# Patient Record
Sex: Female | Born: 1990 | Race: White | Hispanic: No | Marital: Single | Smoking: Never smoker
Health system: Southern US, Community
[De-identification: ages and names within clinical notes are randomized; demographics above are authoritative.]

## PROBLEM LIST (undated history)

## (undated) HISTORY — PX: COLONOSCOPY: SHX174

---

## 2020-08-29 ENCOUNTER — Encounter (HOSPITAL_COMMUNITY)
Admission: EM | Disposition: A | Discharge status: Home / Self Care | Payer: Self-pay | Source: Home / Self Care | Attending: Obstetrics and Gynecology

## 2020-08-29 ENCOUNTER — Emergency Department (HOSPITAL_BASED_OUTPATIENT_CLINIC_OR_DEPARTMENT_OTHER): Payer: BLUE CROSS/BLUE SHIELD

## 2020-08-29 ENCOUNTER — Encounter (HOSPITAL_BASED_OUTPATIENT_CLINIC_OR_DEPARTMENT_OTHER): Payer: Self-pay | Admitting: Emergency Medicine

## 2020-08-29 ENCOUNTER — Inpatient Hospital Stay (HOSPITAL_COMMUNITY): Payer: BLUE CROSS/BLUE SHIELD | Admitting: Anesthesiology

## 2020-08-29 ENCOUNTER — Other Ambulatory Visit: Payer: Self-pay

## 2020-08-29 ENCOUNTER — Inpatient Hospital Stay (HOSPITAL_BASED_OUTPATIENT_CLINIC_OR_DEPARTMENT_OTHER)
Admission: EM | Admit: 2020-08-29 | Discharge: 2020-09-01 | DRG: 787 | Disposition: A | Discharge status: Home / Self Care | Payer: BLUE CROSS/BLUE SHIELD | Attending: Obstetrics and Gynecology | Admitting: Obstetrics and Gynecology

## 2020-08-29 ENCOUNTER — Other Ambulatory Visit: Payer: Self-pay | Admitting: Obstetrics and Gynecology

## 2020-08-29 DIAGNOSIS — O9902 Anemia complicating childbirth: Secondary | ICD-10-CM | POA: Diagnosis present

## 2020-08-29 DIAGNOSIS — E669 Obesity, unspecified: Secondary | ICD-10-CM | POA: Diagnosis present

## 2020-08-29 DIAGNOSIS — O1423 HELLP syndrome (HELLP), third trimester: Secondary | ICD-10-CM

## 2020-08-29 DIAGNOSIS — O99214 Obesity complicating childbirth: Secondary | ICD-10-CM | POA: Diagnosis present

## 2020-08-29 DIAGNOSIS — O0930 Supervision of pregnancy with insufficient antenatal care, unspecified trimester: Secondary | ICD-10-CM

## 2020-08-29 DIAGNOSIS — Z20822 Contact with and (suspected) exposure to covid-19: Secondary | ICD-10-CM | POA: Diagnosis present

## 2020-08-29 DIAGNOSIS — O9912 Other diseases of the blood and blood-forming organs and certain disorders involving the immune mechanism complicating childbirth: Secondary | ICD-10-CM | POA: Diagnosis present

## 2020-08-29 DIAGNOSIS — Z23 Encounter for immunization: Secondary | ICD-10-CM

## 2020-08-29 DIAGNOSIS — Z3A32 32 weeks gestation of pregnancy: Secondary | ICD-10-CM

## 2020-08-29 DIAGNOSIS — O1420 HELLP syndrome (HELLP), unspecified trimester: Secondary | ICD-10-CM | POA: Diagnosis present

## 2020-08-29 DIAGNOSIS — D696 Thrombocytopenia, unspecified: Secondary | ICD-10-CM | POA: Diagnosis present

## 2020-08-29 DIAGNOSIS — D589 Hereditary hemolytic anemia, unspecified: Secondary | ICD-10-CM | POA: Diagnosis present

## 2020-08-29 DIAGNOSIS — O1424 HELLP syndrome, complicating childbirth: Principal | ICD-10-CM | POA: Diagnosis present

## 2020-08-29 DIAGNOSIS — O1425 HELLP syndrome, complicating the puerperium: Secondary | ICD-10-CM | POA: Diagnosis not present

## 2020-08-29 DIAGNOSIS — R7401 Elevation of levels of liver transaminase levels: Secondary | ICD-10-CM | POA: Diagnosis present

## 2020-08-29 DIAGNOSIS — K089 Disorder of teeth and supporting structures, unspecified: Secondary | ICD-10-CM

## 2020-08-29 DIAGNOSIS — I10 Essential (primary) hypertension: Secondary | ICD-10-CM

## 2020-08-29 LAB — URINALYSIS, ROUTINE W REFLEX MICROSCOPIC
Bilirubin Urine: NEGATIVE
Glucose, UA: NEGATIVE mg/dL
Ketones, ur: NEGATIVE mg/dL
Nitrite: NEGATIVE
Protein, ur: >300 mg/dL — AB
Specific Gravity, Urine: 1.02 (ref 1.005–1.030)
pH: 6.5 (ref 5.0–8.0)

## 2020-08-29 LAB — CBC WITH DIFFERENTIAL/PLATELET
Abs Immature Granulocytes: 0.06 10*3/uL (ref 0.00–0.07)
Basophils Absolute: 0 10*3/uL (ref 0.0–0.1)
Basophils Relative: 0 %
Eosinophils Absolute: 0.2 10*3/uL (ref 0.0–0.5)
Eosinophils Relative: 2 %
HCT: 23.5 % — ABNORMAL LOW (ref 36.0–46.0)
Hemoglobin: 8.2 g/dL — ABNORMAL LOW (ref 12.0–15.0)
Immature Granulocytes: 1 %
Lymphocytes Relative: 19 %
Lymphs Abs: 1.6 10*3/uL (ref 0.7–4.0)
MCH: 29.5 pg (ref 26.0–34.0)
MCHC: 34.9 g/dL (ref 30.0–36.0)
MCV: 84.5 fL (ref 80.0–100.0)
Monocytes Absolute: 0.6 10*3/uL (ref 0.1–1.0)
Monocytes Relative: 7 %
Neutro Abs: 6 10*3/uL (ref 1.7–7.7)
Neutrophils Relative %: 71 %
Platelets: 54 10*3/uL — ABNORMAL LOW (ref 150–400)
RBC: 2.78 MIL/uL — ABNORMAL LOW (ref 3.87–5.11)
RDW: 13.2 % (ref 11.5–15.5)
WBC: 8.3 10*3/uL (ref 4.0–10.5)
nRBC: 0 % (ref 0.0–0.2)

## 2020-08-29 LAB — PROTIME-INR
INR: 0.8 (ref 0.8–1.2)
INR: 0.9 (ref 0.8–1.2)
Prothrombin Time: 10.9 seconds — ABNORMAL LOW (ref 11.4–15.2)
Prothrombin Time: 12.2 seconds (ref 11.4–15.2)

## 2020-08-29 LAB — CBC
HCT: 36.1 % (ref 36.0–46.0)
Hemoglobin: 12.2 g/dL (ref 12.0–15.0)
MCH: 29 pg (ref 26.0–34.0)
MCHC: 33.8 g/dL (ref 30.0–36.0)
MCV: 85.7 fL (ref 80.0–100.0)
Platelets: 55 10*3/uL — ABNORMAL LOW (ref 150–400)
RBC: 4.21 MIL/uL (ref 3.87–5.11)
RDW: 13.4 % (ref 11.5–15.5)
WBC: 14.1 10*3/uL — ABNORMAL HIGH (ref 4.0–10.5)
nRBC: 0 % (ref 0.0–0.2)

## 2020-08-29 LAB — BASIC METABOLIC PANEL
Anion gap: 11 (ref 5–15)
BUN: 12 mg/dL (ref 6–20)
CO2: 20 mmol/L — ABNORMAL LOW (ref 22–32)
Calcium: 8.2 mg/dL — ABNORMAL LOW (ref 8.9–10.3)
Chloride: 102 mmol/L (ref 98–111)
Creatinine, Ser: 0.77 mg/dL (ref 0.44–1.00)
GFR, Estimated: >60 mL/min (ref >60)
Glucose, Bld: 70 mg/dL (ref 70–99)
Potassium: 3.8 mmol/L (ref 3.5–5.1)
Sodium: 133 mmol/L — ABNORMAL LOW (ref 135–145)

## 2020-08-29 LAB — HEPATIC FUNCTION PANEL
ALT: 55 U/L — ABNORMAL HIGH (ref 0–44)
AST: 60 U/L — ABNORMAL HIGH (ref 15–41)
Albumin: 2.7 g/dL — ABNORMAL LOW (ref 3.5–5.0)
Alkaline Phosphatase: 149 U/L — ABNORMAL HIGH (ref 38–126)
Bilirubin, Direct: 0.2 mg/dL (ref 0.0–0.2)
Indirect Bilirubin: 0.6 mg/dL (ref 0.3–0.9)
Total Bilirubin: 0.8 mg/dL (ref 0.3–1.2)
Total Protein: 6 g/dL — ABNORMAL LOW (ref 6.5–8.1)

## 2020-08-29 LAB — PREPARE RBC (CROSSMATCH)

## 2020-08-29 LAB — RAPID HIV SCREEN (HIV 1/2 AB+AG)
HIV 1/2 Antibodies: NONREACTIVE
HIV-1 P24 Antigen - HIV24: NONREACTIVE

## 2020-08-29 LAB — APTT
aPTT: 27 seconds (ref 24–36)
aPTT: 27 seconds (ref 24–36)

## 2020-08-29 LAB — HCG, QUANTITATIVE, PREGNANCY: hCG, Beta Chain, Quant, S: 30702 m[IU]/mL — ABNORMAL HIGH (ref <5)

## 2020-08-29 LAB — COMPREHENSIVE METABOLIC PANEL
ALT: 55 U/L — ABNORMAL HIGH (ref 0–44)
AST: 55 U/L — ABNORMAL HIGH (ref 15–41)
Albumin: 2.3 g/dL — ABNORMAL LOW (ref 3.5–5.0)
Alkaline Phosphatase: 140 U/L — ABNORMAL HIGH (ref 38–126)
Anion gap: 10 (ref 5–15)
BUN: 8 mg/dL (ref 6–20)
CO2: 18 mmol/L — ABNORMAL LOW (ref 22–32)
Calcium: 8 mg/dL — ABNORMAL LOW (ref 8.9–10.3)
Chloride: 103 mmol/L (ref 98–111)
Creatinine, Ser: 0.78 mg/dL (ref 0.44–1.00)
GFR, Estimated: >60 mL/min (ref >60)
Glucose, Bld: 71 mg/dL (ref 70–99)
Potassium: 4.1 mmol/L (ref 3.5–5.1)
Sodium: 131 mmol/L — ABNORMAL LOW (ref 135–145)
Total Bilirubin: 0.9 mg/dL (ref 0.3–1.2)
Total Protein: 5 g/dL — ABNORMAL LOW (ref 6.5–8.1)

## 2020-08-29 LAB — FIBRINOGEN
Fibrinogen: 666 mg/dL — ABNORMAL HIGH (ref 210–475)
Fibrinogen: 570 mg/dL — ABNORMAL HIGH (ref 210–475)

## 2020-08-29 LAB — RESP PANEL BY RT-PCR (FLU A&B, COVID) ARPGX2
Influenza A by PCR: NEGATIVE
Influenza B by PCR: NEGATIVE
SARS Coronavirus 2 by RT PCR: NEGATIVE

## 2020-08-29 LAB — ABO/RH: ABO/RH(D): O POS

## 2020-08-29 LAB — URINALYSIS, MICROSCOPIC (REFLEX): WBC, UA: >50 WBC/hpf (ref 0–5)

## 2020-08-29 LAB — HEMOGLOBIN A1C
Hgb A1c MFr Bld: 5 % (ref 4.8–5.6)
Mean Plasma Glucose: 96.8 mg/dL

## 2020-08-29 LAB — PREGNANCY, URINE: Preg Test, Ur: POSITIVE — AB

## 2020-08-29 LAB — TSH: TSH: 2.365 u[IU]/mL (ref 0.350–4.500)

## 2020-08-29 LAB — HEPATITIS C ANTIBODY: HCV Ab: NONREACTIVE

## 2020-08-29 LAB — HEPATITIS B SURFACE ANTIGEN: Hepatitis B Surface Ag: NONREACTIVE

## 2020-08-29 SURGERY — Surgical Case
Anesthesia: General

## 2020-08-29 MED ORDER — OXYCODONE HCL 5 MG PO TABS: 5.0000 mg | ORAL_TABLET | Freq: Once | ORAL | Status: DC | PRN

## 2020-08-29 MED ORDER — CHLORHEXIDINE GLUCONATE 0.12 % MT SOLN: 15.0000 mL | Freq: Once | OROMUCOSAL | Status: DC

## 2020-08-29 MED ORDER — MAGNESIUM SULFATE 4 GM/100ML IV SOLN
4.0000 g | Freq: Once | INTRAVENOUS | Status: AC
  Administered 2020-08-29: 4 g via INTRAVENOUS
  Filled 2020-08-29: qty 100

## 2020-08-29 MED ORDER — STERILE WATER FOR IRRIGATION IR SOLN
Status: DC | PRN
Start: 2020-08-29 — End: 2020-08-29
  Administered 2020-08-29: 1

## 2020-08-29 MED ORDER — LABETALOL HCL 5 MG/ML IV SOLN
80.0000 mg | INTRAVENOUS | Status: DC | PRN
  Administered 2020-08-29: 80 mg via INTRAVENOUS

## 2020-08-29 MED ORDER — MAGNESIUM SULFATE 40 GM/1000ML IV SOLN: 2.0000 g/h | INTRAVENOUS | Status: DC

## 2020-08-29 MED ORDER — LACTATED RINGERS IV BOLUS
50.0000 mL | Freq: Once | INTRAVENOUS | Status: AC
  Administered 2020-08-29: 50 mL via INTRAVENOUS

## 2020-08-29 MED ORDER — CEFAZOLIN SODIUM-DEXTROSE 2-4 GM/100ML-% IV SOLN
INTRAVENOUS | Status: AC
  Filled 2020-08-29: qty 100

## 2020-08-29 MED ORDER — ORAL CARE MOUTH RINSE: 15.0000 mL | Freq: Once | OROMUCOSAL | Status: DC

## 2020-08-29 MED ORDER — ONDANSETRON HCL 4 MG/2ML IJ SOLN: 4.0000 mg | Freq: Four times a day (QID) | INTRAMUSCULAR | Status: DC | PRN

## 2020-08-29 MED ORDER — BETAMETHASONE SOD PHOS & ACET 6 (3-3) MG/ML IJ SUSP
12.0000 mg | Freq: Once | INTRAMUSCULAR | Status: DC
  Filled 2020-08-29: qty 2

## 2020-08-29 MED ORDER — LABETALOL HCL 5 MG/ML IV SOLN
INTRAVENOUS | Status: AC
  Filled 2020-08-29: qty 4

## 2020-08-29 MED ORDER — LIDOCAINE HCL (CARDIAC) PF 100 MG/5ML IV SOSY
PREFILLED_SYRINGE | INTRAVENOUS | Status: DC | PRN
  Administered 2020-08-29: 100 mg via INTRAVENOUS

## 2020-08-29 MED ORDER — LACTATED RINGERS IV SOLN
INTRAVENOUS | Status: DC
  Administered 2020-08-29: 75 mL/h via INTRAVENOUS

## 2020-08-29 MED ORDER — LABETALOL HCL 5 MG/ML IV SOLN: 20.0000 mg | INTRAVENOUS | Status: DC | PRN

## 2020-08-29 MED ORDER — LABETALOL HCL 5 MG/ML IV SOLN
40.0000 mg | INTRAVENOUS | Status: DC | PRN
  Administered 2020-08-29: 40 mg via INTRAVENOUS

## 2020-08-29 MED ORDER — SODIUM CHLORIDE 0.9% FLUSH: 9.0000 mL | INTRAVENOUS | Status: DC | PRN

## 2020-08-29 MED ORDER — OXYTOCIN-SODIUM CHLORIDE 30-0.9 UT/500ML-% IV SOLN: 2.5000 [IU]/h | INTRAVENOUS | Status: AC

## 2020-08-29 MED ORDER — PROMETHAZINE HCL 25 MG/ML IJ SOLN: 6.2500 mg | INTRAMUSCULAR | Status: DC | PRN

## 2020-08-29 MED ORDER — MENTHOL 3 MG MT LOZG: 1.0000 | LOZENGE | OROMUCOSAL | Status: DC | PRN

## 2020-08-29 MED ORDER — LABETALOL HCL 5 MG/ML IV SOLN
20.0000 mg | INTRAVENOUS | Status: DC | PRN
  Administered 2020-08-29: 20 mg via INTRAVENOUS
  Filled 2020-08-29: qty 4

## 2020-08-29 MED ORDER — CEFAZOLIN SODIUM-DEXTROSE 2-4 GM/100ML-% IV SOLN
2.0000 g | INTRAVENOUS | Status: AC
  Administered 2020-08-29: 2 g via INTRAVENOUS

## 2020-08-29 MED ORDER — PROPOFOL 10 MG/ML IV BOLUS
INTRAVENOUS | Status: DC | PRN
  Administered 2020-08-29: 200 mg via INTRAVENOUS

## 2020-08-29 MED ORDER — HYDRALAZINE HCL 20 MG/ML IJ SOLN
INTRAMUSCULAR | Status: AC
  Filled 2020-08-29: qty 1

## 2020-08-29 MED ORDER — HYDRALAZINE HCL 20 MG/ML IJ SOLN
10.0000 mg | INTRAMUSCULAR | Status: DC | PRN
  Administered 2020-08-29: 10 mg via INTRAVENOUS

## 2020-08-29 MED ORDER — SIMETHICONE 80 MG PO CHEW
80.0000 mg | CHEWABLE_TABLET | Freq: Three times a day (TID) | ORAL | Status: DC
  Administered 2020-08-30 – 2020-09-01 (×8): 80 mg via ORAL
  Filled 2020-08-29 (×8): qty 1

## 2020-08-29 MED ORDER — SOD CITRATE-CITRIC ACID 500-334 MG/5ML PO SOLN
30.0000 mL | Freq: Once | ORAL | Status: AC
  Administered 2020-08-29: 30 mL via ORAL

## 2020-08-29 MED ORDER — HYDRALAZINE HCL 20 MG/ML IJ SOLN: 10.0000 mg | INTRAMUSCULAR | Status: DC | PRN

## 2020-08-29 MED ORDER — LABETALOL HCL 5 MG/ML IV SOLN
20.0000 mg | Freq: Once | INTRAVENOUS | Status: AC
  Administered 2020-08-29: 20 mg via INTRAVENOUS
  Filled 2020-08-29: qty 4

## 2020-08-29 MED ORDER — DEXAMETHASONE SODIUM PHOSPHATE 10 MG/ML IJ SOLN
INTRAMUSCULAR | Status: DC | PRN
  Administered 2020-08-29: 4 mg via INTRAVENOUS

## 2020-08-29 MED ORDER — SUCCINYLCHOLINE CHLORIDE 20 MG/ML IJ SOLN
INTRAMUSCULAR | Status: DC | PRN
  Administered 2020-08-29: 120 mg via INTRAVENOUS

## 2020-08-29 MED ORDER — COCONUT OIL OIL
1.0000 | TOPICAL_OIL | Status: DC | PRN
Start: 2020-08-29 — End: 2020-09-01
  Administered 2020-08-31: 1 via TOPICAL

## 2020-08-29 MED ORDER — FENTANYL CITRATE (PF) 100 MCG/2ML IJ SOLN
INTRAMUSCULAR | Status: AC
  Filled 2020-08-29: qty 2

## 2020-08-29 MED ORDER — TRANEXAMIC ACID-NACL 1000-0.7 MG/100ML-% IV SOLN
1000.0000 mg | INTRAVENOUS | Status: AC
  Administered 2020-08-29: 1000 mg via INTRAVENOUS

## 2020-08-29 MED ORDER — HYDROMORPHONE 1 MG/ML IV SOLN
INTRAVENOUS | Status: DC
  Administered 2020-08-29: 30 mg via INTRAVENOUS
  Administered 2020-08-30: 0 mL via INTRAVENOUS
  Administered 2020-08-30: 1.4 mL via INTRAVENOUS
  Filled 2020-08-29: qty 30

## 2020-08-29 MED ORDER — SCOPOLAMINE 1 MG/3DAYS TD PT72
1.0000 | MEDICATED_PATCH | Freq: Once | TRANSDERMAL | Status: DC
  Administered 2020-08-29: 1.5 mg via TRANSDERMAL

## 2020-08-29 MED ORDER — SOD CITRATE-CITRIC ACID 500-334 MG/5ML PO SOLN
ORAL | Status: AC
  Filled 2020-08-29: qty 30

## 2020-08-29 MED ORDER — MAGNESIUM SULFATE 40 GM/1000ML IV SOLN
2.0000 g/h | INTRAVENOUS | Status: DC
  Administered 2020-08-29: 2 g/h via INTRAVENOUS

## 2020-08-29 MED ORDER — TRANEXAMIC ACID-NACL 1000-0.7 MG/100ML-% IV SOLN
INTRAVENOUS | Status: AC
  Filled 2020-08-29: qty 100

## 2020-08-29 MED ORDER — LABETALOL HCL 5 MG/ML IV SOLN: 40.0000 mg | INTRAVENOUS | Status: DC | PRN

## 2020-08-29 MED ORDER — ONDANSETRON HCL 4 MG/2ML IJ SOLN
INTRAMUSCULAR | Status: AC
  Filled 2020-08-29: qty 2

## 2020-08-29 MED ORDER — SUCCINYLCHOLINE CHLORIDE 200 MG/10ML IV SOSY
PREFILLED_SYRINGE | INTRAVENOUS | Status: AC
  Filled 2020-08-29: qty 10

## 2020-08-29 MED ORDER — WITCH HAZEL-GLYCERIN EX PADS: 1.0000 "application " | MEDICATED_PAD | CUTANEOUS | Status: DC | PRN

## 2020-08-29 MED ORDER — OXYTOCIN-SODIUM CHLORIDE 30-0.9 UT/500ML-% IV SOLN
INTRAVENOUS | Status: AC
  Filled 2020-08-29: qty 500

## 2020-08-29 MED ORDER — HYDRALAZINE HCL 10 MG PO TABS
10.0000 mg | ORAL_TABLET | Freq: Once | ORAL | Status: AC
  Administered 2020-08-29: 10 mg via ORAL
  Filled 2020-08-29: qty 1

## 2020-08-29 MED ORDER — DIPHENHYDRAMINE HCL 12.5 MG/5ML PO ELIX: 12.5000 mg | ORAL_SOLUTION | Freq: Four times a day (QID) | ORAL | Status: DC | PRN

## 2020-08-29 MED ORDER — BETAMETHASONE SOD PHOS & ACET 6 (3-3) MG/ML IJ SUSP
12.0000 mg | Freq: Once | INTRAMUSCULAR | Status: AC
  Administered 2020-08-29: 12 mg via INTRAMUSCULAR
  Filled 2020-08-29: qty 5

## 2020-08-29 MED ORDER — SOD CITRATE-CITRIC ACID 500-334 MG/5ML PO SOLN: 30.0000 mL | ORAL | Status: DC

## 2020-08-29 MED ORDER — FAMOTIDINE 20 MG PO TABS
20.0000 mg | ORAL_TABLET | Freq: Once | ORAL | Status: AC
  Administered 2020-08-29: 20 mg via ORAL

## 2020-08-29 MED ORDER — LACTATED RINGERS IV SOLN: INTRAVENOUS | Status: DC | PRN

## 2020-08-29 MED ORDER — NALOXONE HCL 0.4 MG/ML IJ SOLN: 0.4000 mg | INTRAMUSCULAR | Status: DC | PRN

## 2020-08-29 MED ORDER — PROPOFOL 10 MG/ML IV BOLUS
INTRAVENOUS | Status: AC
  Filled 2020-08-29: qty 20

## 2020-08-29 MED ORDER — SCOPOLAMINE 1 MG/3DAYS TD PT72
MEDICATED_PATCH | TRANSDERMAL | Status: AC
  Filled 2020-08-29: qty 1

## 2020-08-29 MED ORDER — DIPHENHYDRAMINE HCL 25 MG PO CAPS: 25.0000 mg | ORAL_CAPSULE | Freq: Four times a day (QID) | ORAL | Status: DC | PRN

## 2020-08-29 MED ORDER — FENTANYL CITRATE (PF) 250 MCG/5ML IJ SOLN
INTRAMUSCULAR | Status: AC
  Filled 2020-08-29: qty 5

## 2020-08-29 MED ORDER — SODIUM CHLORIDE 0.9 % IR SOLN
Status: DC | PRN
  Administered 2020-08-29: 1

## 2020-08-29 MED ORDER — ONDANSETRON HCL 4 MG/2ML IJ SOLN
INTRAMUSCULAR | Status: DC | PRN
  Administered 2020-08-29: 4 mg via INTRAVENOUS

## 2020-08-29 MED ORDER — FENTANYL CITRATE (PF) 100 MCG/2ML IJ SOLN
25.0000 ug | INTRAMUSCULAR | Status: DC | PRN
  Administered 2020-08-29 (×2): 25 ug via INTRAVENOUS
  Administered 2020-08-29: 50 ug via INTRAVENOUS

## 2020-08-29 MED ORDER — FAMOTIDINE 20 MG PO TABS
ORAL_TABLET | ORAL | Status: AC
  Filled 2020-08-29: qty 1

## 2020-08-29 MED ORDER — LIDOCAINE HCL (PF) 2 % IJ SOLN
INTRAMUSCULAR | Status: AC
  Filled 2020-08-29: qty 5

## 2020-08-29 MED ORDER — LABETALOL HCL 5 MG/ML IV SOLN: 80.0000 mg | INTRAVENOUS | Status: DC | PRN

## 2020-08-29 MED ORDER — SODIUM CHLORIDE 0.9% IV SOLUTION: Freq: Once | INTRAVENOUS | Status: DC

## 2020-08-29 MED ORDER — MIDAZOLAM HCL 2 MG/2ML IJ SOLN
INTRAMUSCULAR | Status: AC
  Filled 2020-08-29: qty 2

## 2020-08-29 MED ORDER — FENTANYL CITRATE (PF) 100 MCG/2ML IJ SOLN
INTRAMUSCULAR | Status: DC | PRN
  Administered 2020-08-29: 50 ug via INTRAVENOUS
  Administered 2020-08-29: 100 ug via INTRAVENOUS
  Administered 2020-08-29 (×2): 50 ug via INTRAVENOUS

## 2020-08-29 MED ORDER — CEFAZOLIN SODIUM-DEXTROSE 2-4 GM/100ML-% IV SOLN: 2.0000 g | INTRAVENOUS | Status: DC

## 2020-08-29 MED ORDER — MAGNESIUM SULFATE 40 GM/1000ML IV SOLN
INTRAVENOUS | Status: AC
  Filled 2020-08-29: qty 1000

## 2020-08-29 MED ORDER — DIBUCAINE (PERIANAL) 1 % EX OINT: 1.0000 "application " | TOPICAL_OINTMENT | CUTANEOUS | Status: DC | PRN

## 2020-08-29 MED ORDER — TRANEXAMIC ACID-NACL 1000-0.7 MG/100ML-% IV SOLN: 1000.0000 mg | Freq: Once | INTRAVENOUS | Status: DC | PRN

## 2020-08-29 MED ORDER — MAGNESIUM SULFATE 50 % IJ SOLN
1.0000 g | Freq: Once | INTRAMUSCULAR | Status: DC
  Filled 2020-08-29: qty 2

## 2020-08-29 MED ORDER — TETANUS-DIPHTH-ACELL PERTUSSIS 5-2.5-18.5 LF-MCG/0.5 IM SUSY
0.5000 mL | PREFILLED_SYRINGE | Freq: Once | INTRAMUSCULAR | Status: AC
  Administered 2020-08-31: 0.5 mL via INTRAMUSCULAR
  Filled 2020-08-29: qty 0.5

## 2020-08-29 MED ORDER — DIPHENHYDRAMINE HCL 50 MG/ML IJ SOLN: 12.5000 mg | Freq: Four times a day (QID) | INTRAMUSCULAR | Status: DC | PRN

## 2020-08-29 MED ORDER — LACTATED RINGERS IV SOLN: INTRAVENOUS | Status: DC

## 2020-08-29 MED ORDER — OXYCODONE HCL 5 MG/5ML PO SOLN: 5.0000 mg | Freq: Once | ORAL | Status: DC | PRN

## 2020-08-29 MED ORDER — SENNOSIDES-DOCUSATE SODIUM 8.6-50 MG PO TABS
2.0000 | ORAL_TABLET | Freq: Every evening | ORAL | Status: DC | PRN
  Administered 2020-08-30: 2 via ORAL

## 2020-08-29 MED ORDER — MIDAZOLAM HCL 2 MG/2ML IJ SOLN
INTRAMUSCULAR | Status: DC | PRN
  Administered 2020-08-29: 2 mg via INTRAVENOUS

## 2020-08-29 MED ORDER — PRENATAL MULTIVITAMIN CH
1.0000 | ORAL_TABLET | Freq: Every day | ORAL | Status: DC
  Administered 2020-08-30 – 2020-09-01 (×3): 1 via ORAL
  Filled 2020-08-29 (×3): qty 1

## 2020-08-29 MED ORDER — OXYTOCIN-SODIUM CHLORIDE 30-0.9 UT/500ML-% IV SOLN
INTRAVENOUS | Status: DC | PRN
  Administered 2020-08-29: 400 mL via INTRAVENOUS

## 2020-08-29 MED ORDER — DEXAMETHASONE SODIUM PHOSPHATE 4 MG/ML IJ SOLN
INTRAMUSCULAR | Status: AC
  Filled 2020-08-29: qty 1

## 2020-08-29 SURGICAL SUPPLY — 37 items
APL SKNCLS STERI-STRIP NONHPOA (GAUZE/BANDAGES/DRESSINGS) ×2
BENZOIN TINCTURE PRP APPL 2/3 (GAUZE/BANDAGES/DRESSINGS) ×3 IMPLANT
CANISTER SUCT 3000ML PPV (MISCELLANEOUS) ×3 IMPLANT
CHLORAPREP W/TINT 26ML (MISCELLANEOUS) ×3 IMPLANT
CLOSURE STERI STRIP 1/2 X4 (GAUZE/BANDAGES/DRESSINGS) ×3 IMPLANT
DRSG OPSITE POSTOP 4X10 (GAUZE/BANDAGES/DRESSINGS) ×3 IMPLANT
ELECT REM PT RETURN 9FT ADLT (ELECTROSURGICAL) ×3
ELECTRODE REM PT RTRN 9FT ADLT (ELECTROSURGICAL) ×2 IMPLANT
EXTRACTOR VACUUM KIWI (MISCELLANEOUS) ×3 IMPLANT
GLOVE BIOGEL PI IND STRL 7.0 (GLOVE) ×4 IMPLANT
GLOVE BIOGEL PI IND STRL 7.5 (GLOVE) ×2 IMPLANT
GLOVE BIOGEL PI INDICATOR 7.0 (GLOVE) ×2
GLOVE BIOGEL PI INDICATOR 7.5 (GLOVE) ×1
GLOVE SKINSENSE NS SZ7.0 (GLOVE) ×1
GLOVE SKINSENSE STRL SZ7.0 (GLOVE) ×2 IMPLANT
GOWN STRL REUS W/ TWL LRG LVL3 (GOWN DISPOSABLE) ×4 IMPLANT
GOWN STRL REUS W/ TWL XL LVL3 (GOWN DISPOSABLE) ×2 IMPLANT
GOWN STRL REUS W/TWL LRG LVL3 (GOWN DISPOSABLE) ×6
GOWN STRL REUS W/TWL XL LVL3 (GOWN DISPOSABLE) ×3
NS IRRIG 1000ML POUR BTL (IV SOLUTION) ×3 IMPLANT
PACK C SECTION WH (CUSTOM PROCEDURE TRAY) ×3 IMPLANT
PAD ABD 7.5X8 STRL (GAUZE/BANDAGES/DRESSINGS) ×3 IMPLANT
PAD OB MATERNITY 4.3X12.25 (PERSONAL CARE ITEMS) ×3 IMPLANT
PAD PREP 24X48 CUFFED NSTRL (MISCELLANEOUS) ×3 IMPLANT
PENCIL SMOKE EVAC W/HOLSTER (ELECTROSURGICAL) ×3 IMPLANT
SPONGE GAUZE 4X4 12PLY STER LF (GAUZE/BANDAGES/DRESSINGS) ×6 IMPLANT
STRIP CLOSURE SKIN 1/2X4 (GAUZE/BANDAGES/DRESSINGS) ×3 IMPLANT
SUT MNCRL 0 VIOLET CTX 36 (SUTURE) ×4 IMPLANT
SUT MON AB 4-0 PS1 27 (SUTURE) ×3 IMPLANT
SUT MONOCRYL 0 CTX 36 (SUTURE) ×2
SUT PLAIN 2 0 XLH (SUTURE) ×3 IMPLANT
SUT VIC AB 0 CT1 36 (SUTURE) ×6 IMPLANT
SUT VIC AB 3-0 CT1 27 (SUTURE) ×3
SUT VIC AB 3-0 CT1 TAPERPNT 27 (SUTURE) ×2 IMPLANT
TAPE CLOTH SURG 4X10 WHT LF (GAUZE/BANDAGES/DRESSINGS) ×3 IMPLANT
TOWEL OR 17X24 6PK STRL BLUE (TOWEL DISPOSABLE) ×6 IMPLANT
WATER STERILE IRR 1000ML POUR (IV SOLUTION) ×3 IMPLANT

## 2020-08-29 NOTE — H&P (Signed)
Obstetrics Admission History & Physical  08/29/2020 - 1:49 PM Primary OBGYN: None  Chief Complaint: HELLP at 32wks  History of Present Illness  30 y.o. G1 @ 32/6(Dating: BPD in the ED), with the above CC. Pregnancy complicated by: BMI 30s, anxiety/depression.  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 BPD showed 32/6 single live pregnancy with subj normal AF and no previa.  Patient has been on wellbutrin, zmbien prn and OCPs and no period in the past year or so.   She received a 4gm Mg bolus in the ED prior to transfer. No BMZ given and I don't believe labetalol was given there either.  Patient denies any s/s of pre-eclampsia or labor.   Review of Systems: as noted in the History of Present Illness.  Patient Active Problem List   Diagnosis Date Noted  . HELLP (hemolytic anemia/elev liver enzymes/low platelets in pregnancy) 08/29/2020     PMHx: History reviewed. No pertinent past medical history. PSHx: History reviewed. No pertinent surgical history. Medications:  Medications Prior to Admission  Medication Sig Dispense Refill Last Dose  . buPROPion (WELLBUTRIN XL) 150 MG 24 hr tablet TAKE 3 TABLETS BY MOUTH IN THE MORNING     . zolpidem (AMBIEN) 10 MG tablet TAKE 1 TABLET BY MOUTH EVERY NIGHT AT BEDTIME AFTER GETTING INTO BEDTIME        Allergies: has No Known Allergies. OBHx:  OB History  Gravida Para Term Preterm AB Living  1            SAB IAB Ectopic Multiple Live Births               # Outcome Date GA Lbr Len/2nd Weight Sex Delivery Anes PTL Lv  1 Current                  FHx: History reviewed. No pertinent family history. Soc Hx:  Social History   Socioeconomic History  . Marital status: Single    Spouse name: Not on file  . Number of children: Not on file  . Years of education: Not on file  . Highest education level: Not on file  Occupational History  . Not  on file  Tobacco Use  . Smoking status: Never Smoker  . Smokeless tobacco: Not on file  Substance and Sexual Activity  . Alcohol use: Yes    Comment: occ  . Drug use: Never  . Sexual activity: Not on file  Other Topics Concern  . Not on file  Social History Narrative  . Not on file   Social Determinants of Health   Financial Resource Strain: Not on file  Food Insecurity: Not on file  Transportation Needs: Not on file  Physical Activity: Not on file  Stress: Not on file  Social Connections: Not on file  Intimate Partner Violence: Not on file    Objective     Current Vital Signs 24h Vital Sign Ranges  T 98.2 F (36.8 C) Temp  Avg: 98.1 F (36.7 C)  Min: 97.9 F (36.6 C)  Max: 98.2 F (36.8 C)  BP (!) 164/118 BP  Min: 148/112  Max: 164/118  HR 88 Pulse  Avg: 83.6  Min: 71  Max: 99  RR 18 Resp  Avg: 17.9  Min: 11  Max: 20  SaO2 100 % Room Air SpO2  Avg: 98.7 %  Min: 96 %  Max: 100 %  24 Hour I/O Current Shift I/O  Time Ins Outs No intake/output data recorded. No intake/output data recorded.   EFM: 130 baseline, ?accels, no decel, min-mod variability  Toco: quiet  General: Well nourished, well developed female in no acute distress.  Skin:  Warm and dry.  Cardiovascular: S1, S2 normal, no murmur, rub or gallop, regular rate and rhythm Respiratory:  Clear to auscultation bilateral. Normal respiratory effort Abdomen: soft, nttp Neuro/Psych:  Normal mood and affect.    Labs  Recent Labs  Lab 08/29/20 0901  WBC 8.3  HGB 8.2*  HCT 23.5*  PLT 54*    Recent Labs  Lab 08/29/20 0901  NA 133*  K 3.8  CL 102  CO2 20*  BUN 12  CREATININE 0.77  CALCIUM 8.2*  PROT 6.0*  BILITOT 0.8  ALKPHOS 149*  ALT 55*  AST 60*  GLUCOSE 70   PTT 27 PT/INR 10.9/0.8 Negative: COVID  Pending: fibrinogen  Radiology Narrative & Impression  CLINICAL DATA:  30 year old female with hypertension, headache and bilateral lower extremity edema  EXAM: PORTABLE  CHEST 1 VIEW  COMPARISON:  None.  FINDINGS: The lungs are clear and negative for focal airspace consolidation, pulmonary edema or suspicious pulmonary nodule. No pleural effusion or pneumothorax. Cardiac and mediastinal contours are within normal limits. No acute fracture or lytic or blastic osseous lesions. The visualized upper abdominal bowel gas pattern is unremarkable.  IMPRESSION: No active disease.   Electronically Signed   By: Malachy Moan M.D.   On: 08/29/2020 09:47    Assessment & Plan   pt stable *Pregnancy: fetal status reassuring. Variability likely due to Mg *HELLP: continue Mg at 2gm per hour and IV labetalol per protocol. D/w her recommendation for delivery via c-section given remote from delivery. I also d/w her that will do vertical midline skin to cut down on bleeding risk and there is risk of needing blood products, hysterectomy. I also told her she'll likely need general anesthesia; pt aware and is amenable to proceeding with c/s today when labs are back and products ready *Preterm: BMZ to be given now. NICU aware  I asked her and she said it was fine to update her parents; they were called and updated about her status.   Cornelia Copa MD [phone] Attending Center for [facility])

## 2020-08-29 NOTE — Transfer of Care (Signed)
Immediate Anesthesia Transfer of Care Note  Patient: Jessica Zhang  Procedure(s) Performed: CESAREAN SECTION (Bilateral ) APPLICATION OF CELL SAVER (N/A )  Patient Location: PACU  Anesthesia Type:General  Level of Consciousness: awake, alert  and patient cooperative  Airway & Oxygen Therapy: Patient Spontanous Breathing  Post-op Assessment: Report given to RN and Post -op Vital signs reviewed and stable  Post vital signs: Reviewed and stable  Last Vitals:  Vitals Value Taken Time  BP 117/79 08/29/20 1730  Temp 36.4 C 08/29/20 1730  Pulse 79 08/29/20 1732  Resp 11 08/29/20 1732  SpO2 99 % 08/29/20 1732  Vitals shown include unvalidated device data.  Last Pain:  Vitals:   08/29/20 1730  TempSrc: Oral  PainSc: 7          Complications: No complications documented.

## 2020-08-29 NOTE — Progress Notes (Signed)
Spoke with Dr. Vergie Living and told him they were not putting the pt on the fetal monitor. I asked him to call them himself and I was told to call and tell them he said to put the pt on the fetal monitor.

## 2020-08-29 NOTE — Progress Notes (Signed)
Spoke with [facility] RN. No one has called [facility]. The pt should be on the fetal monitor since they are starting magnesium sulfate and the pt has HEELP syndrome. The policy is to call [facility] anytime there is a pt greater than [redacted] weeks gestation. She says that the pt has had an ultrasound, they are not sure of her gestational age. I told her that the pt should be on the fetal monitor. She says they will be transferring her soon.

## 2020-08-29 NOTE — Progress Notes (Signed)
Spoke with Dr. Vergie Living. There is a pt at [facility] that is in HELLP syndrome and is to be transferred here. No one from [facility] has called [facility] to put the pt on the monitor. He says he didn't know they could do that and to call and tell them to place the pt on the monitor.

## 2020-08-29 NOTE — Progress Notes (Signed)
OB Note I was called by [facility] ED with 32wk patient presenting for swelling. She has HELLP syndrome and needs to be delivered. I advised Mg 4gm bolus and 2gm per hour, betamethasone 12mg  IM x 1 and to get a covid test, fibrinogen and PTT and make NPO.  I called NICU and they have bed space available.  I asked Dr. to get her here ASAP. I talked to the OB OR and plan is to repeat her labs when she gets here and c-section once labs and blood products are ready  Wilkie Aye MD [phone] Attending Center for [facility] (Faculty Practice) 08/29/2020 Time: 1209pm

## 2020-08-29 NOTE — Op Note (Signed)
Operative Note   SURGERY DATE: 08/29/2020  PRE-OP DIAGNOSIS:  *Pregnancy at 32/6 by femur length *HELLP syndrome (elevated BPs, AST/ALT; thrombocytopenia) *No prenatal care *BMI 30s  POST-OP DIAGNOSIS: Same. Delivered   PROCEDURE: Primary low transverse cesarean section via Claretha Cooper skin incision with double layer uterine closure  SURGEON: Surgeon(s) and Role:    [place] Bing, MD - Primary  ASSISTANT: None  ANESTHESIA: general  ESTIMATED BLOOD LOSS:  DRAINS: UOP via indwelling foley  TOTAL IV FLUIDS: crystalloid  VTE PROPHYLAXIS: SCDs to bilateral lower extremities  ANTIBIOTICS: Two grams of Cefazolin were given., within 1 hour of skin incision  SPECIMENS: placenta to pathology.   COMPLICATIONS: none  INDICATIONS: Given normal coags and stable platelets, decision made for low transverse skin incision  FINDINGS: No intra-abdominal adhesions were noted. Grossly normal uterus, tubes and ovaries. Clear amniotic fluid, cephalic, loose body cord x 1, female infant, weight 1960gm, APGARs 8/9, intact placenta.  PROCEDURE IN DETAIL: The patient was taken to the operating room where she was prepped and draped in the normal fashion in the dorsal supine position with a leftward tilt.  After a time out was performed, the patient underwent general anesthesia and a Claretha Cooper skin incision was made with the scalpel and carried through to the underlying layer of fascia. The fascia was then bluntly dissected off the rectus muscles and then the muscles and the peritoneum were then separated in the midline and the peritoneum was entered bluntly. The vesicouterine peritoneum was identified and an Teacher, early years/pre placed.  A low transverse hysterotomy was made with the scalpel until the endometrial cavity was breached and the amniotic sac ruptured with the Allis clamp, yielding clear amniotic fluid. This incision was extended bluntly and the infant's head, shoulders and body  were delivered atraumatically.The cord was clamped x 2 and cut, and the infant was handed to the awaiting pediatricians, after delayed cord clamping was not done due to general anesthesia.  The placenta was then gradually expressed from the uterus and then the uterus was exteriorized and cleared of all clots and debris. The hysterotomy was repaired with a running suture of 1-0 monocryl. A second imbricating layer of 1-0 monocryl suture was then placed  to achieve excellent hemostasis.   The uterus and adnexa were then returned to the abdomen, and the hysterotomy and all operative sites were reinspected and excellent hemostasis was noted after irrigation and suction of the abdomen with warm saline.  The peritoneum was closed with a running stitch of 3-0 Vicryl. The fascia was reapproximated with 0 Vicryl in a simple running fashion bilaterally. The subcutaneous layer was then reapproximated with interrupted sutures of 2-0 plain gut, and the skin was then closed with 4-0 monocryl, in a subcuticular fashion.  The patient  tolerated the procedure well. Sponge, lap, needle, and instrument counts were correct x 2. The patient was transferred to the recovery room awake, alert and breathing independently in stable condition.  Cornelia Copa MD Attending Center for [facility])

## 2020-08-29 NOTE — Anesthesia Procedure Notes (Signed)
Procedure Name: Intubation Date/Time: 08/29/2020 4:25 PM Performed by: Sandrea Matte, CRNA Pre-anesthesia Checklist: Patient identified, Emergency Drugs available, Suction available and Patient being monitored Patient Re-evaluated:Patient Re-evaluated prior to induction Oxygen Delivery Method: Circle system utilized Preoxygenation: Pre-oxygenation with 100% oxygen Induction Type: Rapid sequence and Cricoid Pressure applied Laryngoscope Size: Mac and 4 Grade View: Grade I Tube type: Oral Tube size: 7.0 mm Number of attempts: 1 Airway Equipment and Method: Stylet Placement Confirmation: ETT inserted through vocal cords under direct vision,  positive ETCO2,  CO2 detector and breath sounds checked- equal and bilateral Secured at: 21 cm Dental Injury: Teeth and Oropharynx as per pre-operative assessment

## 2020-08-29 NOTE — Progress Notes (Addendum)
OB Note Platelets almost done and then can proceed to OR. Fetus category I with ?accels.   Cornelia Copa MD Attending Center for [employer] (Faculty Practice) 08/29/2020 Time: [phone]

## 2020-08-29 NOTE — ED Notes (Signed)
Report given to [facility], OR at [facility]. All belongings sent with pt

## 2020-08-29 NOTE — Anesthesia Postprocedure Evaluation (Signed)
Anesthesia Post Note  Patient: Jessica Zhang  Procedure(s) Performed: CESAREAN SECTION (Bilateral ) APPLICATION OF CELL SAVER (N/A )     Patient location during evaluation: PACU Anesthesia Type: General Level of consciousness: awake and alert Pain management: pain level controlled Vital Signs Assessment: post-procedure vital signs reviewed and stable Respiratory status: spontaneous breathing, nonlabored ventilation and respiratory function stable Cardiovascular status: blood pressure returned to baseline and stable Postop Assessment: no apparent nausea or vomiting Anesthetic complications: no   No complications documented.  Last Vitals:  Vitals:   08/29/20 1855 08/29/20 2019  BP: 124/77 (!) 132/92  Pulse: 64 (!) 58  Resp:  17  Temp: 36.6 C 36.5 C  SpO2: 96% 100%    Last Pain:  Vitals:   08/29/20 2019  TempSrc: Oral  PainSc:    Pain Goal:                   Beryle Lathe

## 2020-08-29 NOTE — ED Notes (Signed)
Patient transported to ultrasound.

## 2020-08-29 NOTE — Progress Notes (Signed)
Spoke with [facility] RN and told her the pt needs to be on the fetal monitor at the request of Dr. Vergie Living. She says they are getting the pt ready for transfer. EMS has arrived.

## 2020-08-29 NOTE — Lactation Note (Signed)
This note was copied from a baby's chart. Lactation Consultation Note  Patient Name: Jessica Zhang [id number] Date: 08/29/2020 Reason for consult: Infant < 6lbs;Preterm <34wks;Initial assessment;Primapara;1st time breastfeeding;NICU baby;Other (Comment) (HELLP syndrome) Age:30 hours  Visited with mom of 7 hours old pre-term female, she's a P1 and didn't report breast changes during the prengancy. Her RN has already set her up with a pump, even though mom had HELLP syndrome and complications during delivery (she came to the ED and didn't know she was pregnant) she was willing to start pumping today.  LC showed mom how to hand express and she was able to get a small droplet of colostrum, praised her for her efforts. Mom noted that her baby might not be a 47 0/7 as stated on provider's note but closer to [redacted] weeks gestation. Baby still on NPO and under observation but mom is aware that if she gets droplets of colostrum they can be used to provide oral care for baby.  Reviewed benefits of breastmilk for NICU babies, premature milk, pumping schedule, breastmilk storage guidelines and lactogenesis II. Mom pumped again for the second time during [facility] consultation, praised her for her efforts.  Feeding plan:  1. Encouraged mom to pump every 2-3 hours, ideally 8 pumping sessions/24 hours but she'll go at her own pace until she's more stable with HELLP syndrome 2. Hand expression and breast massage were also encouraged prior pumping 3. She'll call her insurance company to inquire about her DEBP  BF brochure, BF resources and NICU booklet were reviewed. GOB (maternal) present and very supportive, MOB is the only child. Family reported all questions and concerns were answered, they're aware of LC OP services and will call PRN.   Maternal Data Has patient been taught Hand Expression?: Yes Does the patient have breastfeeding experience prior to this delivery?: No  Feeding Mother's Current Feeding Choice:  Breast Milk and Formula Nipple Type: Nfant Extra Slow Flow (gold)  Lactation Tools Discussed/Used Tools: Pump Breast pump type: Double-Electric Breast Pump Pump Education: Setup, frequency, and cleaning;Milk Storage Reason for Pumping: pre-term NICU baby < 5 lbs Pumping frequency: q 3 hours Pumped volume: 0 mL  Interventions Interventions: Breast feeding basics reviewed;DEBP;Hand express;Breast massage  Discharge Pump: Personal (She'll call [employer] to ask for a DEBP) WIC Program: No  Consult Status Consult Status: Follow-up Date: 08/30/20 Follow-up type: In-patient    Jessica Zhang Jessica Zhang 08/29/2020, 11:41 PM

## 2020-08-29 NOTE — ED Provider Notes (Addendum)
[facility] EMERGENCY DEPARTMENT Provider Note   CSN: [id number] Arrival date & time: 08/29/20  0755     History Chief Complaint  Patient presents with  . Hypertension    Jessica Zhang is a 30 y.o. female.  HPI   30 year old female with no admitted past medical history presents emergency department with concern for high blood pressure.  Patient states about 5 days ago she noticed that she was having swelling of her bilateral feet, when she took her blood pressure was high, she been monitoring it for the past 5 days and noticed that it has remained high.  She does have family history of high blood pressure on her father side.  She does not believe that she is currently pregnant.  She states she is otherwise been in her usual state of health.  Denies any chest pain, shortness of breath, cough, abdominal pain.    History reviewed. No pertinent past medical history.  There are no problems to display for this patient.   History reviewed. No pertinent surgical history.   OB History   No obstetric history on file.     History reviewed. No pertinent family history.  Social History   Tobacco Use  . Smoking status: Never Smoker  Substance Use Topics  . Alcohol use: Yes    Comment: occ  . Drug use: Never    Home Medications Prior to Admission medications   Medication Sig Start Date End Date Taking? Authorizing Provider  buPROPion (WELLBUTRIN XL) 150 MG 24 hr tablet TAKE 3 TABLETS BY MOUTH IN THE MORNING 07/04/19  Yes [provider]  zolpidem (AMBIEN) 10 MG tablet TAKE 1 TABLET BY MOUTH EVERY NIGHT AT BEDTIME AFTER GETTING INTO BEDTIME 07/04/19  Yes [provider]    Allergies    Patient has no allergy information on record.  Review of Systems   Review of Systems  Constitutional: Negative for chills and fever.  HENT: Negative for congestion.   Eyes: Negative for visual disturbance.  Respiratory: Negative for cough and shortness of breath.    Cardiovascular: Positive for palpitations and leg swelling. Negative for chest pain.  Gastrointestinal: Negative for abdominal pain, diarrhea and vomiting.  Genitourinary: Negative for dysuria.  Skin: Negative for rash.  Neurological: Negative for headaches.    Physical Exam Updated Vital Signs BP (!) 157/105   Pulse 88   Temp 97.9 F (36.6 C) (Oral)   Resp 16   Ht 5\' 8"  (1.727 m)   Wt 97.5 kg   SpO2 96%   BMI 32.69 kg/m   Physical Exam Vitals and nursing note reviewed.  Constitutional:      Appearance: Normal appearance.  HENT:     Head: Normocephalic.     Mouth/Throat:     Mouth: Mucous membranes are moist.  Cardiovascular:     Rate and Rhythm: Normal rate.  Pulmonary:     Effort: Pulmonary effort is normal. No respiratory distress.     Breath sounds: No rales.  Abdominal:     Palpations: Abdomen is soft.     Tenderness: There is no abdominal tenderness.  Musculoskeletal:     Comments: Trace edema of the bilateral feet/ankles, no calf swelling, no calf tenderness  Skin:    General: Skin is warm.  Neurological:     Mental Status: She is alert and oriented to person, place, and time. Mental status is at baseline.  Psychiatric:        Mood and Affect: Mood normal.  ED Results / Procedures / Treatments   Labs (all labs ordered are listed, but only abnormal results are displayed) Labs Reviewed  PREGNANCY, URINE  CBC WITH DIFFERENTIAL/PLATELET  BASIC METABOLIC PANEL  URINALYSIS, ROUTINE W REFLEX MICROSCOPIC    EKG None  Radiology No results found.  Procedures .Critical Care Performed by: Rozelle Logan, DO Authorized by: Rozelle Logan, DO   Critical care provider statement:    Critical care time (minutes):  45   Critical care was time spent personally by me on the following activities:  Discussions with consultants, evaluation of patient's response to treatment, examination of patient, ordering and performing treatments and interventions,  ordering and review of laboratory studies, ordering and review of radiographic studies, pulse oximetry, re-evaluation of patient's condition, obtaining history from patient or surrogate and review of old charts Comments:     Newly diagnosed preeclampsia with HELLP syndrome, multiple consultations and emergent medications/transfer.     Medications Ordered in ED Medications  hydrALAZINE (APRESOLINE) tablet 10 mg (has no administration in time range)    ED Course  I have reviewed the triage vital signs and the nursing notes.  Pertinent labs & imaging results that were available during my care of the patient were reviewed by me and considered in my medical decision making (see chart for details).    MDM Rules/Calculators/A&P                          30 year old female presents the emergency department with concern for hypertension.  She is hypertensive on arrival but otherwise normal vitals.  She has trace edema of the bilateral feet/ankles, no calf swelling, does not seem consistent with DVT given its bilateral nature.  Arrival EKG is sinus rhythm with a rate of 83, no acute ischemic changes.  She is chest pain-free and otherwise denies any symptoms.  Pregnancy test is positive, further work-up shows that the patient is actually estimated to be about [redacted] weeks pregnant by femur length. She is completely unaware of this. More concerning is her blood work and presentation is consistent with HELLP syndrome.  OB/GYN Dr. Vergie Living consulted, recommends treatment for help with IV magnesium, IM betamethasone and labetalol.  She will be transferred emergently to [facility] hospital for C-section and further treatment.  Final Clinical Impression(s) / ED Diagnoses Final diagnoses:  None    Rx / DC Orders ED Discharge Orders    None       Rozelle Logan, DO 08/29/20 1217    Layken Doenges, Clabe Seal, DO 08/30/20 [phone]

## 2020-08-29 NOTE — ED Triage Notes (Signed)
Pt arrives pov with c/o acute onset hypertension, HA, and bilateral lower extremity swelling x 5 days. Pt denies CP, denies hx of hypertension

## 2020-08-29 NOTE — ED Notes (Signed)
ED Provider at bedside. 

## 2020-08-29 NOTE — Anesthesia Preprocedure Evaluation (Addendum)
Anesthesia Evaluation  Patient identified by MRN, date of birth, ID band Patient awake    Reviewed: Allergy & Precautions, NPO status , Patient's Chart, lab work & pertinent test results  History of Anesthesia Complications Negative for: history of anesthetic complications  Airway Mallampati: I  TM Distance: >3 FB Neck ROM: Full    Dental  (+) Dental Advisory Given, Teeth Intact   Pulmonary neg pulmonary ROS,    Pulmonary exam normal        Cardiovascular hypertension (HELLP), Normal cardiovascular exam     Neuro/Psych negative neurological ROS  negative psych ROS   GI/Hepatic negative GI ROS,  Elevated LFTs    Endo/Other   Obesity   Renal/GU negative Renal ROS     Musculoskeletal negative musculoskeletal ROS (+)   Abdominal   Peds  Hematology  (+) anemia ,  Thrombocytopenia, PLT 54k    Anesthesia Other Findings HELLP syndrome Covid test negative   Reproductive/Obstetrics (+) Pregnancy                            Anesthesia Physical Anesthesia Plan  ASA: II and emergent  Anesthesia Plan: General   Post-op Pain Management:    Induction: Intravenous and Rapid sequence  PONV Risk Score and Plan: 3 and Treatment may vary due to age or medical condition, Ondansetron, Dexamethasone and Scopolamine patch - Pre-op  Airway Management Planned: Oral ETT  Additional Equipment: None  Intra-op Plan:   Post-operative Plan: Extubation in OR  Informed Consent: I have reviewed the patients History and Physical, chart, labs and discussed the procedure including the risks, benefits and alternatives for the proposed anesthesia with the patient or authorized representative who has indicated his/her understanding and acceptance.     Dental advisory given  Plan Discussed with: CRNA and Anesthesiologist  Anesthesia Plan Comments:        Anesthesia Quick Evaluation

## 2020-08-30 ENCOUNTER — Encounter (HOSPITAL_COMMUNITY): Payer: Self-pay | Admitting: Obstetrics and Gynecology

## 2020-08-30 DIAGNOSIS — E669 Obesity, unspecified: Secondary | ICD-10-CM | POA: Diagnosis present

## 2020-08-30 DIAGNOSIS — K089 Disorder of teeth and supporting structures, unspecified: Secondary | ICD-10-CM

## 2020-08-30 DIAGNOSIS — O1425 HELLP syndrome, complicating the puerperium: Secondary | ICD-10-CM

## 2020-08-30 DIAGNOSIS — O0930 Supervision of pregnancy with insufficient antenatal care, unspecified trimester: Secondary | ICD-10-CM

## 2020-08-30 DIAGNOSIS — O99119 Other diseases of the blood and blood-forming organs and certain disorders involving the immune mechanism complicating pregnancy, unspecified trimester: Secondary | ICD-10-CM | POA: Diagnosis present

## 2020-08-30 DIAGNOSIS — D696 Thrombocytopenia, unspecified: Secondary | ICD-10-CM | POA: Diagnosis present

## 2020-08-30 DIAGNOSIS — R7401 Elevation of levels of liver transaminase levels: Secondary | ICD-10-CM | POA: Diagnosis present

## 2020-08-30 LAB — BPAM PLATELET PHERESIS
Blood Product Expiration Date: 202204032359
Blood Product Expiration Date: 202204032359
ISSUE DATE / TIME: 202204021505
ISSUE DATE / TIME: 202204021505
Unit Type and Rh: 5100
Unit Type and Rh: 6200

## 2020-08-30 LAB — CBC
HCT: 37 % (ref 36.0–46.0)
HCT: 32.4 % — ABNORMAL LOW (ref 36.0–46.0)
Hemoglobin: 12.3 g/dL (ref 12.0–15.0)
Hemoglobin: 11.3 g/dL — ABNORMAL LOW (ref 12.0–15.0)
MCH: 29.2 pg (ref 26.0–34.0)
MCH: 30.1 pg (ref 26.0–34.0)
MCHC: 33.2 g/dL (ref 30.0–36.0)
MCHC: 34.9 g/dL (ref 30.0–36.0)
MCV: 87.9 fL (ref 80.0–100.0)
MCV: 86.2 fL (ref 80.0–100.0)
Platelets: 100 10*3/uL — ABNORMAL LOW (ref 150–400)
Platelets: 121 10*3/uL — ABNORMAL LOW (ref 150–400)
RBC: 4.21 MIL/uL (ref 3.87–5.11)
RBC: 3.76 MIL/uL — ABNORMAL LOW (ref 3.87–5.11)
RDW: 13.9 % (ref 11.5–15.5)
RDW: 13.9 % (ref 11.5–15.5)
WBC: 18.4 10*3/uL — ABNORMAL HIGH (ref 4.0–10.5)
WBC: 20 10*3/uL — ABNORMAL HIGH (ref 4.0–10.5)
nRBC: 0 % (ref 0.0–0.2)
nRBC: 0 % (ref 0.0–0.2)

## 2020-08-30 LAB — COMPREHENSIVE METABOLIC PANEL
ALT: 37 U/L (ref 0–44)
AST: 34 U/L (ref 15–41)
Albumin: 2 g/dL — ABNORMAL LOW (ref 3.5–5.0)
Alkaline Phosphatase: 115 U/L (ref 38–126)
Anion gap: 8 (ref 5–15)
BUN: 10 mg/dL (ref 6–20)
CO2: 20 mmol/L — ABNORMAL LOW (ref 22–32)
Calcium: 6.5 mg/dL — ABNORMAL LOW (ref 8.9–10.3)
Chloride: 106 mmol/L (ref 98–111)
Creatinine, Ser: 0.82 mg/dL (ref 0.44–1.00)
GFR, Estimated: >60 mL/min (ref >60)
Glucose, Bld: 119 mg/dL — ABNORMAL HIGH (ref 70–99)
Potassium: 4.9 mmol/L (ref 3.5–5.1)
Sodium: 134 mmol/L — ABNORMAL LOW (ref 135–145)
Total Bilirubin: 0.2 mg/dL — ABNORMAL LOW (ref 0.3–1.2)
Total Protein: 4.6 g/dL — ABNORMAL LOW (ref 6.5–8.1)

## 2020-08-30 LAB — PREPARE PLATELET PHERESIS
Unit division: 0
Unit division: 0

## 2020-08-30 LAB — RPR: RPR Ser Ql: NONREACTIVE

## 2020-08-30 MED ORDER — ZOLPIDEM TARTRATE 5 MG PO TABS
5.0000 mg | ORAL_TABLET | Freq: Every evening | ORAL | Status: DC | PRN
  Administered 2020-08-30 – 2020-08-31 (×2): 5 mg via ORAL
  Filled 2020-08-30 (×2): qty 1

## 2020-08-30 MED ORDER — LACTATED RINGERS IV SOLN: INTRAVENOUS | Status: DC

## 2020-08-30 MED ORDER — MAGNESIUM SULFATE 40 GM/1000ML IV SOLN
2.0000 g/h | INTRAVENOUS | Status: AC
  Administered 2020-08-30: 2 g/h via INTRAVENOUS

## 2020-08-30 MED ORDER — BUPROPION HCL ER (XL) 300 MG PO TB24
450.0000 mg | ORAL_TABLET | Freq: Every day | ORAL | Status: DC
  Administered 2020-08-30 – 2020-09-01 (×3): 450 mg via ORAL
  Filled 2020-08-30 (×3): qty 1

## 2020-08-30 MED ORDER — BUPROPION HCL ER (XL) 150 MG PO TB24: 150.0000 mg | ORAL_TABLET | Freq: Every day | ORAL | Status: DC

## 2020-08-30 MED ORDER — OXYCODONE HCL 5 MG PO TABS: 5.0000 mg | ORAL_TABLET | Freq: Four times a day (QID) | ORAL | Status: DC | PRN

## 2020-08-30 MED ORDER — MAGNESIUM SULFATE 40 GM/1000ML IV SOLN
INTRAVENOUS | Status: AC
  Filled 2020-08-30: qty 1000

## 2020-08-30 NOTE — Progress Notes (Signed)
Daily Postpartum Note  Admission Date: 08/29/2020 Current Date: 08/30/2020 9:14 AM  Jessica Zhang is a 30 y.o. G1P0101, POD#1 s/p pLTCS at 32wks for HELLP remote from delivery.  Pregnancy complicated by:  Patient Active Problem List   Diagnosis Date Noted  . Obesity (BMI 30-39.9) 08/30/2020  . No prenatal care in current pregnancy 08/30/2020  . Poor dentition 08/30/2020  . Thrombocytopenia affecting pregnancy (HCC) 08/30/2020  . Transaminitis 08/30/2020  . HELLP (hemolytic anemia/elev liver enzymes/low platelets in pregnancy) 08/29/2020    Overnight/24hr events:  None  Subjective:  Feels weak and tired which she thinks is from the Mg. No s/s of pre-eclampsia No flatus, ambulation, nausea.   Objective:    Current Vital Signs 24h Vital Sign Ranges  T 98 F (36.7 C) Temp  Avg: 97.8 F (36.6 C)  Min: 97.5 F (36.4 C)  Max: 98.2 F (36.8 C)  BP 123/81 BP  Min: 117/79  Max: 168/112  HR 64 Pulse  Avg: 77.1  Min: 58  Max: 99  RR 17 Resp  Avg: 18.2  Min: 16  Max: 22  SaO2 100 % Room Air SpO2  Avg: 98.9 %  Min: 96 %  Max: 100 %       24 Hour I/O Current Shift I/O  Time Ins Outs 04/02 0701 - 04/03 0700 In: 2357.4 [P.O.:360; I.V.:1331.4] Out: 2767 [Urine:2350] 04/03 0701 - 04/03 1900 In: 120 [P.O.:120] Out: 850 [Urine:850]   Patient Vitals for the past 24 hrs:  BP Temp Temp src Pulse Resp SpO2 Height Weight  08/30/20 0755 -- -- -- -- -- 100 % -- --  08/30/20 0750 -- -- -- -- -- 99 % -- --  08/30/20 0745 -- -- -- -- -- 99 % -- --  08/30/20 0740 123/81 98 F (36.7 C) Oral 64 17 100 % -- --  08/30/20 0735 -- -- -- -- -- 99 % -- --  08/30/20 0730 -- -- -- -- -- 97 % -- --  08/30/20 0725 -- -- -- -- -- 98 % -- --  08/30/20 0720 -- -- -- -- -- 97 % -- --  08/30/20 0715 -- -- -- -- -- 98 % -- --  08/30/20 0710 -- -- -- -- -- 98 % -- --  08/30/20 0705 -- -- -- -- -- 99 % -- --  08/30/20 0700 -- -- -- -- -- 98 % -- --  08/30/20 0655 -- -- -- -- -- 98 % -- --  08/30/20 0650 --  -- -- -- -- 99 % -- --  08/30/20 0649 -- -- -- -- 19 100 % -- --  08/30/20 0645 -- -- -- -- -- 99 % -- --  08/30/20 0641 -- -- -- -- -- 98 % -- --  08/30/20 0640 -- -- -- -- -- 98 % -- --  08/30/20 0635 -- -- -- -- -- 98 % -- --  08/30/20 0630 -- -- -- -- -- 98 % -- --  08/30/20 0625 -- -- -- -- -- 98 % -- --  08/30/20 0620 -- -- -- -- -- 99 % -- --  08/30/20 0615 -- -- -- -- -- 100 % -- --  08/30/20 0611 -- -- -- -- 18 100 % -- --  08/30/20 0610 -- -- -- -- -- 100 % -- --  08/30/20 0605 -- -- -- -- -- 100 % -- --  08/30/20 0600 -- -- -- -- -- 100 % -- --  08/30/20 0505 -- -- -- --  19 -- -- --  08/30/20 0406 119/76 97.8 F (36.6 C) Oral 71 17 100 % -- --  08/30/20 0300 -- -- -- -- 18 -- -- --  08/30/20 0210 -- -- -- -- 18 -- -- --  08/30/20 0200 -- -- -- -- 18 99 % -- --  08/30/20 0100 -- -- -- -- 18 98 % -- --  08/30/20 0015 -- -- -- -- 18 99 % -- --  08/29/20 2301 120/80 97.6 F (36.4 C) Oral 73 18 100 % -- --  08/29/20 2200 124/82 -- -- 65 -- 99 % -- --  08/29/20 2100 (!) 133/93 -- -- 71 -- 100 % -- --  08/29/20 2019 (!) 132/92 97.7 F (36.5 C) Oral (!) 58 17 100 % -- --  08/29/20 1855 124/77 97.8 F (36.6 C) Oral 64 -- 96 % -- --  08/29/20 1825 -- -- -- 65 16 99 % -- --  08/29/20 1745 -- -- -- -- 16 -- -- --  08/29/20 1730 117/79 (!) 97.5 F (36.4 C) Oral 80 18 99 % -- --  08/29/20 1559 -- 97.7 F (36.5 C) Oral -- 20 -- -- --  08/29/20 1534 -- -- -- 88 (!) 22 -- -- --  08/29/20 1530 (!) 133/91 97.6 F (36.4 C) Oral -- -- -- -- --  08/29/20 1444 (!) 147/119 -- -- -- 20 -- -- --  08/29/20 1400 -- -- -- -- 20 -- -- --  08/29/20 1353 (!) 163/109 -- -- 91 -- -- -- --  08/29/20 1348 -- 98.1 F (36.7 C) Oral -- 16 -- 5\' 8"  (1.727 m) 97.5 kg  08/29/20 1345 (!) 162/113 -- -- -- -- -- -- --  08/29/20 1330 (!) 168/112 -- -- -- -- -- -- --  08/29/20 1328 (!) 164/128 -- -- 92 16 -- -- --  08/29/20 1226 (!) 164/118 98.2 F (36.8 C) -- 88 18 100 % -- --  08/29/20 1047 (!)  160/108 -- -- 99 20 99 % -- --  08/29/20 1000 (!) 148/112 -- -- 88 20 100 % -- --   UOP>163mL/hr  Physical exam: General: Well nourished, well developed female in no acute distress. Abdomen: nttp, rare BS, c/d/i pressure dressing Cardiovascular: S1, S2 normal, no murmur, rub or gallop, regular rate and rhythm Respiratory: CTAB Extremities: no clubbing, cyanosis or edema Skin: Warm and dry.   Medications: Current Facility-Administered Medications  Medication Dose Route Frequency Provider Last Rate Last Admin  . buPROPion (WELLBUTRIN XL) 24 hr tablet 150 mg  150 mg Oral Daily 80m, MD      . coconut oil  1 application Topical PRN [place] Bing, MD      . witch hazel-glycerin (TUCKS) pad 1 application  1 application Topical PRN [place] Bing, MD       And  . dibucaine (NUPERCAINAL) 1 % rectal ointment 1 application  1 application Rectal PRN [place] Bing, MD      . diphenhydrAMINE (BENADRYL) injection 12.5 mg  12.5 mg Intravenous Q6H PRN [place] Bing, MD       Or  . diphenhydrAMINE (BENADRYL) 12.5 MG/5ML elixir 12.5 mg  12.5 mg Oral Q6H PRN [place] Bing, MD      . diphenhydrAMINE (BENADRYL) capsule 25 mg  25 mg Oral Q6H PRN [place] Bing, MD      . HYDROmorphone (DILAUDID) 1 mg/mL PCA injection   Intravenous Q4H [place] Bing, MD   30 mg at 08/29/20 2200  . lactated ringers infusion  Intravenous Continuous [place] Bing, MD      . menthol-cetylpyridinium (CEPACOL) lozenge 3 mg  1 lozenge Oral Q2H PRN [place] Bing, MD      . naloxone [facility]) injection 0.4 mg  0.4 mg Intravenous PRN [place] Bing, MD       And  . sodium chloride flush (NS) 0.9 % injection 9 mL  9 mL Intravenous PRN [place] Bing, MD      . ondansetron (ZOFRAN) injection 4 mg  4 mg Intravenous Q6H PRN [place] Bing, MD      . oxytocin (PITOCIN) IV infusion 30 units in NS 500 mL - Premix  2.5 Units/hr Intravenous Continuous [place] Bing, MD   Stopped at 08/29/20 2156   . prenatal multivitamin tablet 1 tablet  1 tablet Oral Q1200 [place] Bing, MD      . senna-docusate (Senokot-S) tablet 2 tablet  2 tablet Oral QHS PRN [place] Bing, MD      . simethicone (MYLICON) chewable tablet 80 mg  80 mg Oral TID PC [place] Bing, MD      . Tdap (BOOSTRIX) injection 0.5 mL  0.5 mL Intramuscular Once [place] Bing, MD        Labs:  Recent Labs  Lab 08/29/20 0901 08/29/20 1347 08/30/20 0426  WBC 8.3 14.1* 18.4*  HGB 8.2* 12.2 12.3  HCT 23.5* 36.1 37.0  PLT 54* 55* 100*    Recent Labs  Lab 08/29/20 0901 08/29/20 1347  NA 133* 131*  K 3.8 4.1  CL 102 103  CO2 20* 18*  BUN 12 8  CREATININE 0.77 0.78  CALCIUM 8.2* 8.0*  PROT 6.0* 5.0*  BILITOT 0.8 0.9  ALKPHOS 149* 140*  ALT 55* 55*  AST 60* 55*  GLUCOSE 70 71    Radiology:  No new imaging  Assessment & Plan:  Pt doing well *PP: routine PP and post op care. Continue foley until Mg off due to fall risk. Continue pca until Mg off due to feels too tired to eat *HELLP: cmp didn't get drawn this morning. F/u rpt labs at noon. Continue mg x 24h (off at 1630 today). BPs doing well on no meds *no prenatal care: OB labs negative. F/u any pending ones. SW consulted *PPx: SCDs. Hold lovenox due to plts *FEN/GI: MIVF, regular diet *Dispo: likely pod#3  Cornelia Copa MD Attending Center for [facility] GYN Consult Phone: [phone] (M-F, [phone]) & [phone] (Off hours, weekends, holidays)

## 2020-08-30 NOTE — Progress Notes (Signed)
Pt standing at bed side BP 96/48, feeling light headed and dizzy. Pt backed to bed and feeling better.  Will continue to monitor

## 2020-08-30 NOTE — Plan of Care (Signed)
  Problem: Education: Goal: Knowledge of General Education information will improve Description Including pain rating scale, medication(s)/side effects and non-pharmacologic comfort measures Outcome: Progressing   

## 2020-08-30 NOTE — Lactation Note (Signed)
This note was copied from a baby's chart. Lactation Consultation Note  Patient Name: Boy [place] YCXKG'Y Date: 08/30/2020 Reason for consult: Initial assessment;NICU baby;Preterm <34wks Age:30 hours   Mom resting with grandmother in room.  Mom has pumped twice since delivery.  LC reviewed pump settings and provided basin and soap for cleaning parts.  Mom is on Mag currently.  LC reviewed importance of pumping and continuing to pump even if mom doesn't see any milk.  She states she does know how to hand express.    She denies further concerns or questions.  LC encouraged mom to pump every 2-3 hours and to take a 4 hour stretch at night if needed for rest.    LC encouraged mom to call of lactation if she needed assistance or had questions regarding the pumping for her NICU infant.  Maternal Data    Feeding Mother's Current Feeding Choice: Breast Milk and Formula Nipple Type: Nfant Extra Slow Flow (gold)  LATCH Score                    Lactation Tools Discussed/Used Breast pump type: Double-Electric Breast Pump Pump Education: Setup, frequency, and cleaning Reason for Pumping: pre-term NICU baby Pumping frequency: Mom was encouraged to pump 8 times in 24 hours.  Mom has pumped 2X, infant 15 hours old  Interventions Interventions: Education  Discharge Pump: DEBP  Consult Status Consult Status: Follow-up Date: 08/31/20 Follow-up type: In-patient    Maryruth Hancock [facility] 08/30/2020, 8:21 AM

## 2020-08-31 ENCOUNTER — Encounter (HOSPITAL_COMMUNITY): Payer: Self-pay | Admitting: Obstetrics and Gynecology

## 2020-08-31 LAB — CBC
HCT: 31.6 % — ABNORMAL LOW (ref 36.0–46.0)
Hemoglobin: 10.6 g/dL — ABNORMAL LOW (ref 12.0–15.0)
MCH: 29.2 pg (ref 26.0–34.0)
MCHC: 33.5 g/dL (ref 30.0–36.0)
MCV: 87.1 fL (ref 80.0–100.0)
Platelets: 138 10*3/uL — ABNORMAL LOW (ref 150–400)
RBC: 3.63 MIL/uL — ABNORMAL LOW (ref 3.87–5.11)
RDW: 14.3 % (ref 11.5–15.5)
WBC: 20.1 10*3/uL — ABNORMAL HIGH (ref 4.0–10.5)
nRBC: 0 % (ref 0.0–0.2)

## 2020-08-31 LAB — COMPREHENSIVE METABOLIC PANEL
ALT: 35 U/L (ref 0–44)
AST: 31 U/L (ref 15–41)
Albumin: 1.9 g/dL — ABNORMAL LOW (ref 3.5–5.0)
Alkaline Phosphatase: 133 U/L — ABNORMAL HIGH (ref 38–126)
Anion gap: 7 (ref 5–15)
BUN: 11 mg/dL (ref 6–20)
CO2: 26 mmol/L (ref 22–32)
Calcium: 7.1 mg/dL — ABNORMAL LOW (ref 8.9–10.3)
Chloride: 104 mmol/L (ref 98–111)
Creatinine, Ser: 0.92 mg/dL (ref 0.44–1.00)
GFR, Estimated: >60 mL/min (ref >60)
Glucose, Bld: 105 mg/dL — ABNORMAL HIGH (ref 70–99)
Potassium: 4.4 mmol/L (ref 3.5–5.1)
Sodium: 137 mmol/L (ref 135–145)
Total Bilirubin: 0.1 mg/dL — ABNORMAL LOW (ref 0.3–1.2)
Total Protein: 4.5 g/dL — ABNORMAL LOW (ref 6.5–8.1)

## 2020-08-31 MED ORDER — NIFEDIPINE ER OSMOTIC RELEASE 30 MG PO TB24
30.0000 mg | ORAL_TABLET | Freq: Every day | ORAL | Status: DC
  Administered 2020-08-31 – 2020-09-01 (×2): 30 mg via ORAL
  Filled 2020-08-31 (×2): qty 1

## 2020-08-31 NOTE — Clinical Social Work Maternal (Signed)
CLINICAL SOCIAL WORK MATERNAL/CHILD NOTE  Patient Details  Name: Jessica Zhang MRN: [id number] Date of Birth: 05/20/1991  Date:  08/31/2020  Clinical Social Worker Initiating Note:  Jessica Zhang Date/Time: Initiated:  08/31/20/1422     Child's Name:  Unknown at this time   Biological Parents:  Mother,Father (Father: Jessica Zhang)   Need for Interpreter:  None   Reason for Referral:  Late Zhang No Prenatal Care ,Other (Comment),Behavioral Health Concerns (Infant's NICU admission)   Address:  [address], [place] [address]   Phone number:  [phone] (home)     Additional phone number:   Household Members/Support Persons (HM/SP):       HM/SP Name Relationship DOB Zhang Age  HM/SP -1        HM/SP -2        HM/SP -3        HM/SP -4        HM/SP -5        HM/SP -6        HM/SP -7        HM/SP -8          Natural Supports (not living in the home):  Parent,Spouse/significant other   Professional Supports: Therapist   Employment: Full-time   Type of Work: Corporate treasurer   Education:  Forensic psychologist   Homebound arranged:    Museum/gallery curator Resources:  Multimedia programmer   Other Resources:      Cultural/Religious Considerations Which May Impact Care:    Strengths:  Ability to meet basic needs ,Understanding of illness,Psychotropic Medications   Psychotropic Medications:  Wellbutrin      Pediatrician:       Pediatrician List:   [facility]      Pediatrician Fax Number:    Risk Factors/Current Problems:  Mental Health Concerns    Cognitive State:  Able to Concentrate ,Alert ,Insightful ,Goal Oriented ,Linear Thinking    Mood/Affect:  Calm ,Interested ,Comfortable ,Relaxed    CSW Assessment: CSW met with MOB at bedside to discuss no prenatal care, infant's NICU admission and behavioral health concerns, FOB present. CSW introduced self. MOB granted  CSW verbal permission to speak in front of FOB about anything. CSW explained reason for visit. MOB was welcoming, pleasant and remained engaged during assessment. MOB reported that she resides alone and works as a Corporate treasurer. MOB reported that she feels they will be able to get all essential items needed to care for infant. MOB shared that they already ordered a car seat. CSW inquired about MOB's support system, MOB reported that FOB and her parents are supports.   CSW inquired about MOB's mental health history. MOB reported that she was diagnosed with depression in June 2019. MOB reported that she is currently taking Wellbutrin and Ambien which are helpful. MOB reported that she is also participating in therapy and seeing a psychiatrist. MOB reported that both therapy and seeing the psychiatrist are helpful. CSW inquired about how MOB was feeling emotionally after having infant, MOB reported that she is still shocked but feels good. MOB presented calm and did not demonstrate any acute mental health signs/symptoms. MOB possessed good insight about her mental health. CSW assessed for safety, MOB denied SI and HI. CSW did not assess for domestic violence as FOB was present. CSW informed MOB that she may be more susceptible to  postpartum depression due to her mental health history.   CSW provided education regarding the baby blues period vs. perinatal mood disorders, discussed treatment and gave resources for mental health follow up if concerns arise.  CSW recommends self-evaluation during the postpartum time period using the New Mom Checklist from Postpartum Progress and encouraged MOB to contact a medical professional if symptoms are noted at any time.    CSW provided review of Sudden Infant Death Syndrome (SIDS) precautions.    CSW and parents discussed infant's NICU admission. CSW informed parents about the NICU, what to expect and resources/supports available while infant is admitted to the NICU. MOB  reported that everyone has been nice and they feel well informed about infant's care. MOB denied any transportation barriers with visiting infant in the NICU. MOB shared that dependent upon the length of stay for infant they may decide to have infant transferred to a hospital closer to [facility] home. CSW encouraged MOB to keep staff updated on her plan. MOB denied any questions/concerns regarding the NICU.   CSW informed MOB about the hospital drug screen policy due to no prenatal care. MOB confirmed that she was unaware that she was pregnant and shared that she was taking birth control. CSW inquired about any illegal substance use during pregnancy, MOB reported none. CSW informed MOB that infant's UDS was negative and CDS would continue to be monitored and a CPS report would be made if warranted. MOB denied any questions.   CSW will continue to offer resources/supports while infant is admitted to the NICU.    CSW Plan/Description:  Psychosocial Support and Ongoing Assessment of Needs,Sudden Infant Death Syndrome (SIDS) Education,Perinatal Mood and Anxiety Disorder (PMADs) Education,Hospital Drug Screen Policy Information,CSW Will Continue to Monitor Umbilical Cord Tissue Drug Screen Results and Make Report if Jessica Or, LCSW 02-03-2021, 2:26 PM

## 2020-08-31 NOTE — Progress Notes (Signed)
Subjective: Postpartum Day 2: Cesarean Delivery at 32 weeks for HELLP  Jessica Zhang has no complaints this morning. Up to restroom voiding without problems, tolerating diet, good oral pain control. Denies HA or visual changes.  Objective: Vital signs in last 24 hours: Temp:  [97.6 F (36.4 C)-98.7 F (37.1 C)] 98.2 F (36.8 C) (04/04 0420) Pulse Rate:  [64-78] 78 (04/04 0420) Resp:  [17-23] 19 (04/04 0420) BP: (116-139)/(62-81) 116/62 (04/04 0420) SpO2:  [98 %-100 %] 99 % (04/04 0420)  Physical Exam:  General: alert Lochia: appropriate Uterine Fundus: firm Incision: dressing intact DVT Evaluation: No evidence of DVT seen on physical exam.  Recent Labs    08/30/20 1144 08/31/20 0431  HGB 11.3* 10.6*  HCT 32.4* 31.6*    Assessment/Plan: Status post Cesarean section. Doing well postoperatively. BP stable off magnesium will continue to monitor. HELLP labs improving Continue current care.  Jessica Zhang 08/31/2020, 7:32 AM

## 2020-08-31 NOTE — Lactation Note (Signed)
This note was copied from a baby's chart. Lactation Consultation Note  Patient Name: Jessica Zhang [id number] Date: 08/31/2020 Reason for consult: Follow-up assessment;Mother's request;Primapara;1st time breastfeeding;NICU baby;Preterm <34wks;Infant < 6lbs Age:30 hours   Mom pumped with DEBP 4x today, at the time of the visit colostrum leaking from the right breast during the pump session. LC assessed flange size increased to 27. RN to provide coconut oil for nipple care. Mom aware to use coconut oil before pumping.   EBM good for 4 hrs room temperature. Mom to alert RN to provide labels to transport EBM to NICU.   LC reviewed with Mom NICU book. LC reviewed with Mom lactation inpatient and outpatient services. Mom aware she can pump in the NICU as well.   All questions answered at the end of the visit.   Maternal Data Does the patient have breastfeeding experience prior to this delivery?: No  Feeding Mother's Current Feeding Choice: Breast Milk and Formula  LATCH Score                    Lactation Tools Discussed/Used Tools: Pump;Flanges;Coconut oil Flange Size: 27 Breast pump type: Double-Electric Breast Pump Pump Education: Setup, frequency, and cleaning;Milk Storage Reason for Pumping: increase stimulation Pumping frequency: every 3 hrs for 15 minutes  Interventions Interventions: Breast feeding basics reviewed;DEBP;Breast massage;Education;Expressed milk;Coconut oil  Discharge WIC Program: No  Consult Status Consult Status: Follow-up Date: 09/01/20 Follow-up type: In-patient    Jessica Krejci  Zhang 08/31/2020, 6:00 PM

## 2020-09-01 ENCOUNTER — Other Ambulatory Visit (HOSPITAL_COMMUNITY): Payer: Self-pay

## 2020-09-01 LAB — BPAM RBC
Blood Product Expiration Date: 202204292359
Blood Product Expiration Date: 202204292359
ISSUE DATE / TIME: 202204021511
ISSUE DATE / TIME: 202204021511
Unit Type and Rh: 5100
Unit Type and Rh: 5100

## 2020-09-01 LAB — TYPE AND SCREEN
ABO/RH(D): O POS
Antibody Screen: NEGATIVE
Unit division: 0
Unit division: 0

## 2020-09-01 MED ORDER — OXYCODONE-ACETAMINOPHEN 5-325 MG PO TABS: 1.0000 | ORAL_TABLET | Freq: Four times a day (QID) | ORAL | 0 refills | Status: DC | PRN

## 2020-09-01 MED ORDER — NIFEDIPINE ER 30 MG PO TB24
60.0000 mg | ORAL_TABLET | Freq: Every day | ORAL | 0 refills | Status: AC
  Filled 2020-09-01 (×2): qty 60, 30d supply, fill #0

## 2020-09-01 MED ORDER — OXYCODONE-ACETAMINOPHEN 5-325 MG PO TABS
1.0000 | ORAL_TABLET | Freq: Four times a day (QID) | ORAL | 0 refills | Status: AC | PRN
  Filled 2020-09-01: qty 15, 2d supply, fill #0

## 2020-09-01 MED ORDER — NIFEDIPINE ER 30 MG PO TB24: 60.0000 mg | ORAL_TABLET | Freq: Every day | ORAL | 0 refills | Status: DC

## 2020-09-01 NOTE — Lactation Note (Signed)
This note was copied from a baby's chart. Lactation Consultation Note  Patient Name: Jessica Zhang [id number] Date: 09/01/2020 Reason for consult: NICU baby;Follow-up assessment Age:30 hours  Mom without + breast changes today. She has pumped this morning with 2-3 mL yield. Encouraged increasing frequency and combining HE/DEBP. Mom to contact [employer] for pump p d/c. Reviewed normal pumping volumes and strategies to achieve. Expected pumping volumes: Day 0-2 = approx. 90mL / 24 hours Day 3-7 = > / 24 hours Day 7-14 = > / 24 hours  Feeding Mother's Current Feeding Choice: Breast Milk and Formula  Interventions Interventions: Hand express;DEBP;Education  Discharge Discharge Education: Engorgement and breast care  Consult Status Consult Status: Follow-up Follow-up type: In-patient   Elder Negus, MA IBCLC 09/01/2020, 9:04 AM

## 2020-09-01 NOTE — Progress Notes (Signed)
CSW notified that MOB was interested in [facility].   CSW met with MOB at bedside to confirm interest in [facility]. MOB confirmed interest, CSW informed MOB about [facility] option as it is closer. MOB reported that she is interested in [facility]. CSW agreed to ask [facility] to follow up with MOB regarding availability and application process, MOB agreeable. CSW inquired about any additional needs/concerns, MOB reported none.   CSW notified [facility] of MOB's interest, [facility] agreed to follow up with MOB.   Jessica Zhang, [place] Worker [facility] Cell#: [phone]

## 2020-09-01 NOTE — Progress Notes (Signed)
Pt given discharge instructions and all questions answered. Pt verbalized understanding. Awaiting pt setup at [facility] prior to discharge.

## 2020-09-01 NOTE — Discharge Summary (Signed)
Postpartum Discharge Summary     Patient Name: Jessica Zhang DOB: January 04, 1991 MRN: [id number]  Date of admission: 08/29/2020 Delivery date:08/29/2020  Delivering provider: Aletha Halim  Date of discharge: 09/01/2020  Admitting diagnosis: HTN (hypertension) [I10] Hemolysis, elevated liver enzymes, and low platelet (HELLP) syndrome during pregnancy in third trimester [O14.23] HELLP (hemolytic anemia/elev liver enzymes/low platelets in pregnancy) [O14.20] Intrauterine pregnancy: Unknown     Secondary diagnosis:  Active Problems:   HELLP (hemolytic anemia/elev liver enzymes/low platelets in pregnancy)   Obesity (BMI 30-39.9)   No prenatal care in current pregnancy   Poor dentition   Thrombocytopenia affecting pregnancy (HCC)   Transaminitis  Additional problems: None    Discharge diagnosis: Preterm Pregnancy Delivered, Preeclampsia (severe) and Anemia                                              Post partum procedures:none Augmentation: N/A Complications: None  Hospital course: Sceduled C/S   30 y.o. yo G1P1 at Unknown was admitted to the hospital 08/29/2020 after arriving without kowing she was pregnant to ED and noted to be pregnant, have HELLP syndrome. Primary cesarean section done with the following indication:HELLP.Delivery details are as follows:  Membrane Rupture Time/Date: 4:27 PM ,08/29/2020   Delivery Method:C-Section, Low Transverse  Details of operation can be found in separate operative note.  Patient had an uncomplicated postpartum course.  She is ambulating, tolerating a regular diet, passing flatus, and urinating well. Patient is discharged home in stable condition on  09/01/20        Newborn Data: Birth date:08/29/2020  Birth time:4:27 PM  Gender:Female  Living status:Living  Apgars:8 ,9  Weight:1960 g     Magnesium Sulfate received: Yes: Seizure prophylaxis BMZ received: Yes Rhophylac:N/A MMR:No T-DaP:Given postpartum Flu: No Transfusion:No  Physical exam   Vitals:   08/31/20 1927 08/31/20 2323 09/01/20 0427 09/01/20 0728  BP: (!) 139/97 (!) 151/86 123/66 124/84  Pulse: 98 84 91 91  Resp: _0 Temp: 99 F (37.2 C) 98.5 F (36.9 C) 98.2 F (36.8 C) 98.1 F (36.7 C)  TempSrc: Oral Oral Oral Oral  SpO2: 100% 100% 100% 98%  Weight:      Height:       General: alert, cooperative and no distress Lochia: appropriate Uterine Fundus: firm Incision: Healing well with no significant drainage DVT Evaluation: No evidence of DVT seen on physical exam. Labs: Lab Results  Component Value Date   WBC 20.1 (H) 08/31/2020   HGB 10.6 (L) 08/31/2020   HCT 31.6 (L) 08/31/2020   MCV 87.1 08/31/2020   PLT 138 (L) 08/31/2020   CMP Latest Ref Rng & Units 08/31/2020  Glucose 70 - 99 mg/dL 105(H)  BUN 6 - 20 mg/dL 11  Creatinine 0.44 - 1.00 mg/dL 0.92  Sodium 135 - 145 mmol/L 137  Potassium 3.5 - 5.1 mmol/L 4.4  Chloride 98 - 111 mmol/L 104  CO2 22 - 32 mmol/L 26  Calcium 8.9 - 10.3 mg/dL 7.1(L)  Total Protein 6.5 - 8.1 g/dL 4.5(L)  Total Bilirubin 0.3 - 1.2 mg/dL 0.1(L)  Alkaline Phos 38 - 126 U/L 133(H)  AST 15 - 41 U/L 31  ALT 0 - 44 U/L 35   Edinburgh Score: No flowsheet data found.   After visit meds:  Allergies as of 09/01/2020   No Known Allergies  Medication List    TAKE these medications   buPROPion 150 MG 24 hr tablet Commonly known as: WELLBUTRIN XL TAKE 3 TABLETS BY MOUTH IN THE MORNING   NIFEdipine 30 MG 24 hr tablet Commonly known as: ADALAT CC Take 2 tablets (60 mg total) by mouth daily.   oxyCODONE-acetaminophen 5-325 MG tablet Commonly known as: PERCOCET/ROXICET Take 1-2 tablets by mouth every 6 (six) hours as needed.   zolpidem 10 MG tablet Commonly known as: AMBIEN TAKE 1 TABLET BY MOUTH EVERY NIGHT AT BEDTIME AFTER GETTING INTO BEDTIME        Discharge home in stable condition Infant Feeding: Breast Infant Disposition:NICU Discharge instruction: per After Visit Summary and Postpartum  booklet. Activity: Advance as tolerated. Pelvic rest for 6 weeks.  Diet: routine diet Future Appointments:No future appointments. Follow up Visit:  [place] for [place] at [facility] Follow up in 1 week(s).   Specialty: Obstetrics and Gynecology Why: for BP check Contact information: [place] [address] [phone]               Please schedule this patient for a In person postpartum visit in 2-3 days with the following provider: RN. Additional Postpartum F/U:Incision check 1 week and BP check 2-3 days  High risk pregnancy complicated by: HTN Delivery mode:  C-Section, Low Transverse  Anticipated Birth Control:  OCPs   09/01/2020 Donnamae Jude, MD

## 2020-09-01 NOTE — Discharge Instructions (Signed)
Cesarean Delivery, Care After This sheet gives you information about how to care for yourself after your procedure. Your health care provider may also give you more specific instructions. If you have problems or questions, contact your health care provider. What can I expect after the procedure? After the procedure, it is common to have:  A small amount of blood or clear fluid coming from the incision.  Some redness, swelling, and pain in your incision area.  Some abdominal pain and soreness.  Vaginal bleeding (lochia). Even though you did not have a vaginal delivery, you will still have vaginal bleeding and discharge.  Pelvic cramps.  Fatigue. You may have pain, swelling, and discomfort in the tissue between your vagina and your anus (perineum) if:  Your C-section was unplanned, and you were allowed to labor and push.  An incision was made in the area (episiotomy) or the tissue tore during attempted vaginal delivery. Follow these instructions at home: Incision care  Follow instructions from your health care provider about how to take care of your incision. Make sure you: ? Wash your hands with soap and water before you change your bandage (dressing). If soap and water are not available, use hand sanitizer. ? If you have a dressing, change it or remove it as told by your health care provider. ? Leave stitches (sutures), skin staples, skin glue, or adhesive strips in place. These skin closures may need to stay in place for 2 weeks or longer. If adhesive strip edges start to loosen and curl up, you may trim the loose edges. Do not remove adhesive strips completely unless your health care provider tells you to do that.  Check your incision area every day for signs of infection. Check for: ? More redness, swelling, or pain. ? More fluid or blood. ? Warmth. ? Pus or a bad smell.  Do not take baths, swim, or use a hot tub until your health care provider says it's okay. Ask your health  care provider if you can take showers.  When you cough or sneeze, hug a pillow. This helps with pain and decreases the chance of your incision opening up (dehiscing). Do this until your incision heals.   Medicines  Take over-the-counter and prescription medicines only as told by your health care provider.  If you were prescribed an antibiotic medicine, take it as told by your health care provider. Do not stop taking the antibiotic even if you start to feel better.  Do not drive or use heavy machinery while taking prescription pain medicine. Lifestyle  Do not drink alcohol. This is especially important if you are breastfeeding or taking pain medicine.  Do not use any products that contain nicotine or tobacco, such as cigarettes, e-cigarettes, and chewing tobacco. If you need help quitting, ask your health care provider. Eating and drinking  Drink at least 8 eight-ounce glasses of water every day unless told not to by your health care provider. If you breastfeed, you may need to drink even more water.  Eat high-fiber foods every day. These foods may help prevent or relieve constipation. High-fiber foods include: ? Whole grain cereals and breads. ? Brown rice. ? Beans. ? Fresh fruits and vegetables. Activity  If possible, have someone help you care for your baby and help with household activities for at least a few days after you leave the hospital.  Return to your normal activities as told by your health care provider. Ask your health care provider what activities are safe for   you.  Rest as much as possible. Try to rest or take a nap while your baby is sleeping.  Do not lift anything that is heavier than 10 lbs (4.5 kg), or the limit that you were told, until your health care provider says that it is safe.  Talk with your health care provider about when you can engage in sexual activity. This may depend on your: ? Risk of infection. ? How fast you heal. ? Comfort and desire to  engage in sexual activity.   General instructions  Do not use tampons or douches until your health care provider approves.  Wear loose, comfortable clothing and a supportive and well-fitting bra.  Keep your perineum clean and dry. Wipe from front to back when you use the toilet.  If you pass a blood clot, save it and call your health care provider to discuss. Do not flush blood clots down the toilet before you get instructions from your health care provider.  Keep all follow-up visits for you and your baby as told by your health care provider. This is important. Contact a health care provider if:  You have: ? A fever. ? Bad-smelling vaginal discharge. ? Pus or a bad smell coming from your incision. ? Difficulty or pain when urinating. ? A sudden increase or decrease in the frequency of your bowel movements. ? More redness, swelling, or pain around your incision. ? More fluid or blood coming from your incision. ? A rash. ? Nausea. ? Little or no interest in activities you used to enjoy. ? Questions about caring for yourself or your baby.  Your incision feels warm to the touch.  Your breasts turn red or become painful or hard.  You feel unusually sad or worried.  You vomit.  You pass a blood clot from your vagina.  You urinate more than usual.  You are dizzy or light-headed. Get help right away if:  You have: ? Pain that does not go away or get better with medicine. ? Chest pain. ? Difficulty breathing. ? Blurred vision or spots in your vision. ? Thoughts about hurting yourself or your baby. ? New pain in your abdomen or in one of your legs. ? A severe headache.  You faint.  You bleed from your vagina so much that you fill more than one sanitary pad in one hour. Bleeding should not be heavier than your heaviest period. Summary  After the procedure, it is common to have pain at your incision site, abdominal cramping, and slight bleeding from your vagina.  Check  your incision area every day for signs of infection.  Tell your health care provider about any unusual symptoms.  Keep all follow-up visits for you and your baby as told by your health care provider. This information is not intended to replace advice given to you by your health care provider. Make sure you discuss any questions you have with your health care provider. Document Revised: 11/22/2017 Document Reviewed: 11/22/2017 Elsevier Patient Education  2021 [employer] Postpartum Hypertension Postpartum hypertension is high blood pressure that is higher than normal after childbirth. It usually starts within 1 to 2 days after delivery, but it can happen at any time for up to 6 weeks after delivery. For some women, medical treatment is required to prevent serious complications, such as seizures or stroke. What are the causes? The cause of this condition is not well understood. In some cases, the cause may not be known. Certain conditions may increase your risk.  These include:  Hypertension that existed before pregnancy (chronic hypertension).  Hypertension that comes as a result of pregnancy (gestational hypertension).  Hypertensive disorders during pregnancy or seizures in women who have high blood pressure during pregnancy. These conditions are called preeclampsia and eclampsia.  A condition in which the liver, platelets, and red blood cells are damaged during pregnancy (HELLP syndrome).  Obesity.  Diabetes. What are the signs or symptoms? As with all types of hypertension, postpartum hypertension may not have any symptoms. Depending on how high your blood pressure is, you may experience:  Headaches. These may be mild, moderate, or severe. They may also be steady, constant, or sudden in onset (thunderclap headache).  Vision changes, such as blurry vision, flashing lights, or seeing spots.  Nausea and vomiting.  Pain in the upper right side of your abdomen.  Shortness of  breath.  Difficulty breathing while lying down.  A decrease in the amount of urine that you pass. How is this diagnosed? This condition may be diagnosed based on the results of a physical exam, blood pressure measurements, and blood and urine tests. You may also have other tests, such as a CT scan or an MRI, to check for other problems of postpartum hypertension. How is this treated? If blood pressure is high enough to require treatment, your options may include:  Medicines to reduce blood pressure (antihypertensives). Tell your health care provider if you are breastfeeding or if you plan to breastfeed. There are many antihypertensive medicines that are safe to take while breastfeeding.  Treating medical conditions that are causing hypertension.  Treating the complications of hypertension, such as seizures, stroke, or kidney problems. Your health care provider will also continue to monitor your blood pressure closely until it is within a safe range for you. Follow these instructions at home: Learn your goal blood pressure Two numbers make up your blood pressure. The first number is called systolic pressure. The second is called diastolic pressure. An example of a blood pressure reading is "120 over 80" (or 120/80). For most people, goal blood pressure is:  First number: below 140.  Second number: below 90. Your blood pressure is above normal even if only the top or bottom number is above normal. Know what to do before you take your blood pressure 30 minutes before you check your blood pressure:  Do not drink caffeine.  Do not drink alcohol.  Avoid food and drink.  Do not smoke.  Do not exercise. 5 minutes before you check your blood pressure:  Use the bathroom and urinate so that you have an empty bladder.  Sit quietly in a dining room chair. Do not sit in a soft couch or an armchair. Do not talk. Know how to take your blood pressure To check your blood pressure, follow the  instructions in the manual that came with your blood pressure monitor. If you have a digital blood pressure monitor, the instructions may be as follows: 1. Sit up straight. 2. Place your feet on the floor. Do not cross your ankles or legs. 3. Rest your left arm at the level of your heart. You may rest it on a table, desk, or chair. 4. Pull up your shirt sleeve. 5. Wrap the blood pressure cuff around the upper part of your left arm. The cuff should be 1 inch (2.5 cm) above your elbow. It is best to wrap the cuff around bare skin. 6. Fit the cuff snugly around your arm. You should be able to place  only one finger between the cuff and your arm. 7. Put the cord inside the groove of your elbow. 8. Press the power button. 9. Sit quietly while the cuff fills with air and loses air. 10. Write down the numbers on the screen. These are your blood pressure readings. 11. Wait 1-2 minutes and then repeat steps 1-10.   Record your blood pressure readings Follow your health care provider's instructions on how to record your blood pressure readings. If you were asked to use this form, follow these instructions:  Get one reading in the morning (a.m.) before you take any medicines.  Get one reading in the evening (p.m.) before supper.  Take at least 2 readings with each blood pressure check. This makes sure the results are correct. Wait 1-2 minutes between measurements.  Write down the results in the spaces on this form. Date: _______________________  a.m. _____________________(1st reading) _____________________(2nd reading)  p.m. _____________________(1st reading) _____________________(2nd reading) Date: _______________________  a.m. _____________________(1st reading) _____________________(2nd reading)  p.m. _____________________(1st reading) _____________________(2nd reading) Date: _______________________  a.m. _____________________(1st reading) _____________________(2nd reading)  p.m.  _____________________(1st reading) _____________________(2nd reading) Date: _______________________  a.m. _____________________(1st reading) _____________________(2nd reading)  p.m. _____________________(1st reading) _____________________(2nd reading) Date: _______________________  a.m. _____________________(1st reading) _____________________(2nd reading)  p.m. _____________________(1st reading) _____________________(2nd reading) General instructions  Take over-the-counter and prescription medicines only as told by your health care provider.  Do not use any products that contain nicotine or tobacco. These products include cigarettes, chewing tobacco, and vaping devices, such as e-cigarettes. If you need help quitting, ask your health care provider.  Check your blood pressure as often as recommended by your health care provider.  Return to your normal activities as told by your health care provider. Ask your health care provider what activities are safe for you.  Keep all follow-up visits. This is important. Contact a health care provider if:  You have new symptoms, such as: ? A headache that does not get better. ? Dizziness. ? Visual changes. ? Nausea and vomiting. Get help right away if:  You develop difficulty breathing.  You have chest pain.  You faint.  You have any symptoms of a stroke. "BE FAST" is an easy way to remember the main warning signs of a stroke: ? B - Balance. Signs are dizziness, sudden trouble walking, or loss of balance. ? E - Eyes. Signs are trouble seeing or a sudden change in vision. ? F - Face. Signs are sudden weakness or numbness of the face, or the face or eyelid drooping on one side. ? A - Arms. Signs are weakness or numbness in an arm. This happens suddenly and usually on one side of the body. ? S - Speech. Signs are sudden trouble speaking, slurred speech, or trouble understanding what people say. ? T - Time. Time to call emergency services.  Write down what time symptoms started.  You have other signs of a stroke, such as: ? A sudden, severe headache with no known cause. ? Nausea or vomiting. ? Seizure. These symptoms may represent a serious problem that is an emergency. Do not wait to see if the symptoms will go away. Get medical help right away. Call your local emergency services (911 in the [place] Do not drive yourself to the hospital. Summary  Postpartum hypertension is high blood pressure that remains higher than normal after childbirth.  For some women, medical treatment is required to prevent serious complications, such as seizures or stroke.  Follow your health care provider's instructions on how  to record your blood pressure readings.  Keep all follow-up visits. This is important. This information is not intended to replace advice given to you by your health care provider. Make sure you discuss any questions you have with your health care provider. Document Revised: 02/11/2020 Document Reviewed: 02/11/2020 Elsevier Patient Education  2021 [employer].

## 2020-09-01 NOTE — Progress Notes (Signed)
Pt discharged in stable condition with all belongings

## 2020-09-02 ENCOUNTER — Other Ambulatory Visit: Payer: Self-pay

## 2020-09-02 ENCOUNTER — Ambulatory Visit (INDEPENDENT_AMBULATORY_CARE_PROVIDER_SITE_OTHER): Payer: BLUE CROSS/BLUE SHIELD

## 2020-09-02 ENCOUNTER — Telehealth: Payer: Self-pay | Admitting: Lactation Services

## 2020-09-02 VITALS — BP 130/82 | HR 100 | Ht 68.0 in | Wt 225.4 lb

## 2020-09-02 DIAGNOSIS — Z0130 Encounter for examination of blood pressure without abnormal findings: Secondary | ICD-10-CM

## 2020-09-02 LAB — SURGICAL PATHOLOGY

## 2020-09-02 NOTE — Telephone Encounter (Signed)
Returned patients call and mom reports they are outside the office. Patient was discharged from the hospital yesterday and baby is in NICU. They are concerned that patient feels weak and wants to have a BP check. Patient was added to nurse schedule for BP check.

## 2020-09-02 NOTE — Progress Notes (Signed)
Pt here today for exhausted, weak when standing, limbs feeling "heavy", dizzy, lightheaded. Pt had delivery via C/S on Sat and had HELLP sx and was discharged from hospital yesterday.   BP 130/82, pt did take BP meds as directed today.   Pt states having eaten only 1 biscuit and a mt dew today. Pt has been visiting baby in NICU and has not rested well since delivery.  Pt advised to get some rest and hydrate and eat well balanced healthy meals to help with getting healed from c/section and to promote breast pumping for infant. Pt agreeable and verbalized understanding. Pt has 1 week BP check on 4/12 at 230 pm, then incision check on 4/19.  Judeth Cornfield, RN  09/02/20.

## 2020-09-02 NOTE — Progress Notes (Signed)
Pt completed application for [facility] and was accepted. Chaplain delivered pt keys and information regarding check in and check out on 09/08/20.   Please page as further needs arise.  Maryanna Shape. Carley Hammed, M.Div. [facility] Chaplain Pager [phone] Office [phone]

## 2020-09-02 NOTE — Progress Notes (Signed)
Initial visit with [place] at baby Palmer's bedside to introduce spiritual care and offer support. Chaplain asked open ended questions to invite story telling. Pt shared her experience of learning that she was pregnant and had dangerous hypertension while in town for furniture market.  She reports she is feeling much better now that she is off of magnesium and more clear. Her mother is here for additional support and she is wrapping her mind around her new identity as mother. Chaplain also reviewed [employer] as a resource and pt would like to pursue.  Will continue to follow.  Please page as further needs arise.  Maryanna Shape. Carley Hammed, M.Div. [facility] Chaplain Pager [phone] Office [phone]

## 2020-09-03 ENCOUNTER — Ambulatory Visit: Payer: Self-pay

## 2020-09-03 NOTE — Lactation Note (Signed)
This note was copied from a baby's chart. Lactation Consultation Note  Patient Name: Jessica Zhang NATFT'D Date: 09/03/2020   Age:30 days   LC attempted to visit with Mom.  Asked RN to call LC upon Mom's arrival to NICU to offer support with pumping.   Mom was discharged without a home pump per Epic notes.  [facility] seen [facility] yesterday for not feeling well and feeling weak.      [name] 09/03/2020, 10:45 AM

## 2020-09-03 NOTE — Progress Notes (Signed)
Patient seen and assessed by nursing staff.  Agree with documentation and plan.  

## 2020-09-04 ENCOUNTER — Ambulatory Visit: Payer: Self-pay

## 2020-09-04 MED FILL — Heparin Sodium (Porcine) Inj 1000 Unit/ML: INTRAMUSCULAR | Qty: 30 | Status: AC

## 2020-09-04 MED FILL — Sodium Chloride IV Soln 0.9%: INTRAVENOUS | Qty: 1000 | Status: AC

## 2020-09-04 NOTE — Lactation Note (Signed)
This note was copied from a baby's chart. Lactation Consultation Note  Patient Name: Jessica Zhang MVHQI'O Date: 09/04/2020 Reason for consult: NICU baby;Follow-up assessment Age:30 days Mom continues to pump q3 with increasing, but likely low, supply today. Infant to transfer to [facility]. [facility] encouraged mom to continue pumping and to f/u with LC's at [facility]. [facility] services here are complete.   Feeding Mother's Current Feeding Choice: Breast Milk and Formula Nipple Type: Dr. Irving Burton Preemie (wide base)     Consult Status Consult Status: Complete Follow-up type: In-patient   Elder Negus, MA IBCLC 09/04/2020, 3:00 PM

## 2020-09-08 ENCOUNTER — Ambulatory Visit: Payer: BLUE CROSS/BLUE SHIELD

## 2020-09-15 ENCOUNTER — Ambulatory Visit: Payer: BLUE CROSS/BLUE SHIELD

## 2020-09-16 ENCOUNTER — Encounter: Payer: Self-pay | Admitting: General Practice

## 2022-11-06 IMAGING — DX DG CHEST 1V PORT
1 series · 1 of 1 positions shown · non-contrast
Comparison: None.

CLINICAL DATA: 29-year-old female with hypertension, headache and
bilateral lower extremity edema

EXAM:
PORTABLE CHEST 1 VIEW

[chest ap]
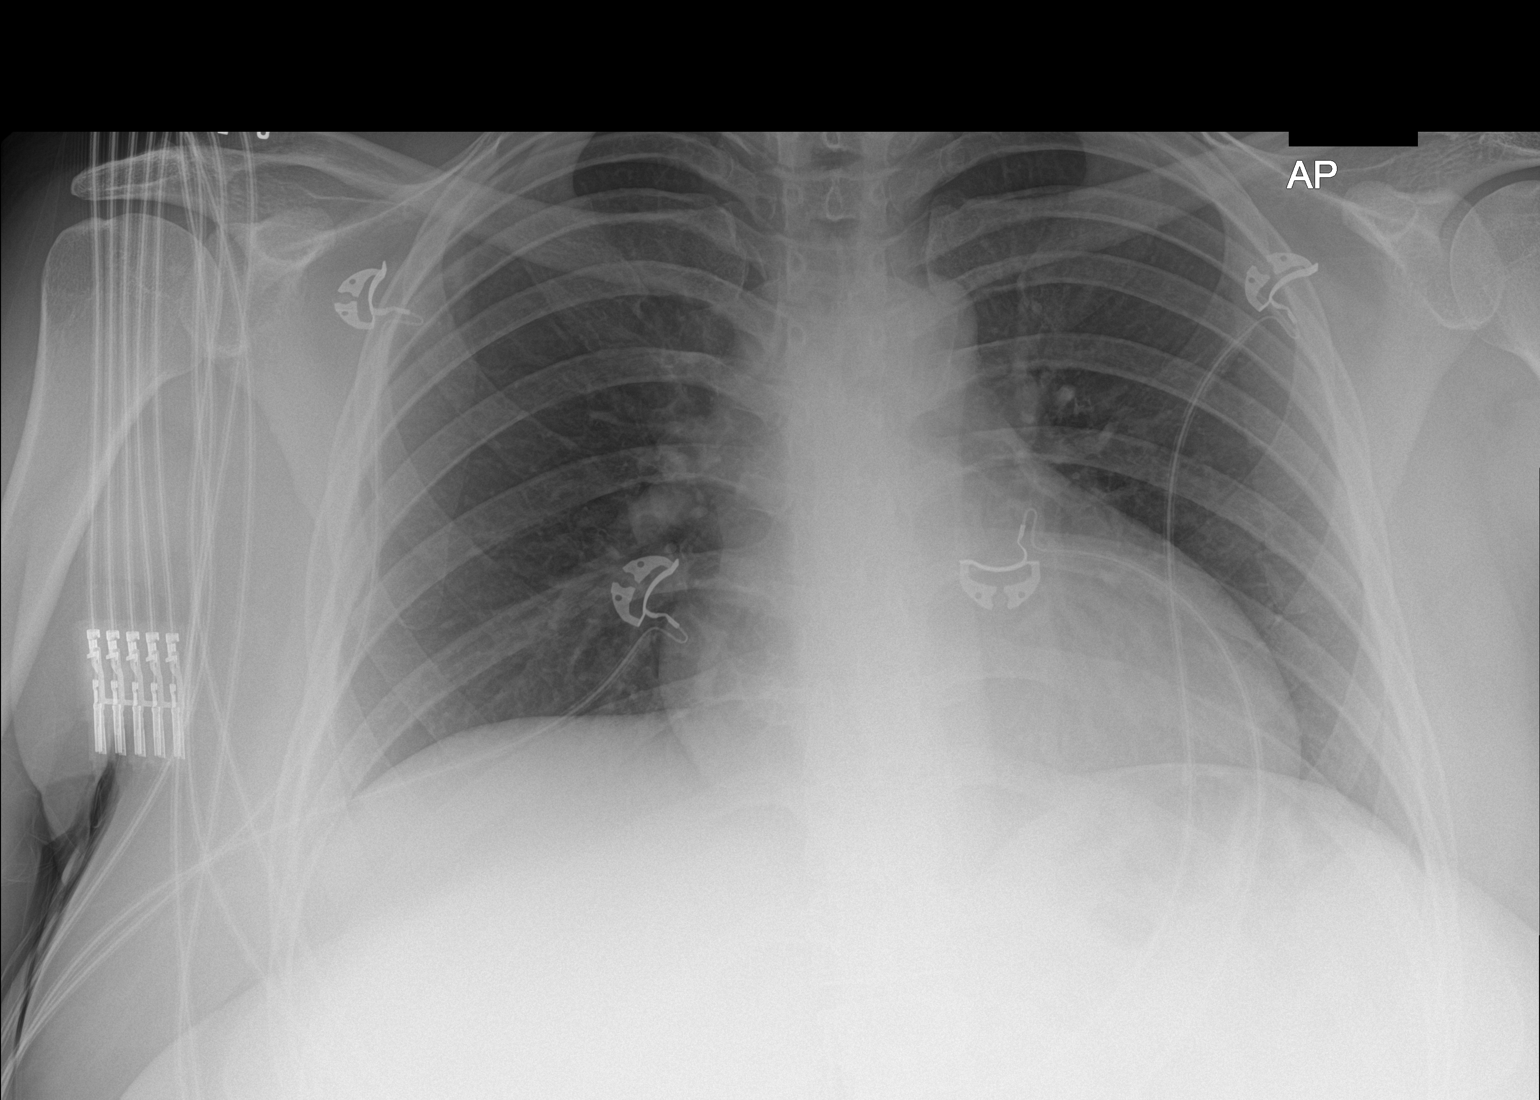

[1 of 1 positions shown; findings below may reference images not displayed]

FINDINGS: The lungs are clear and negative for focal airspace consolidation,
pulmonary edema or suspicious pulmonary nodule. No pleural effusion
or pneumothorax. Cardiac and mediastinal contours are within normal
limits. No acute fracture or lytic or blastic osseous lesions. The
visualized upper abdominal bowel gas pattern is unremarkable.
IMPRESSION: No active disease.

## 2022-11-06 IMAGING — US US OB LIMITED
1 series · 14 of 28 positions shown · non-contrast
Comparison: None.

CLINICAL DATA: Bilateral lower extremity swelling 5 days with
today.

EXAM:
LIMITED OBSTETRIC ULTRASOUND

[Series 1: us ob limited · 14 of 36 slices shown]
[im 2/36]
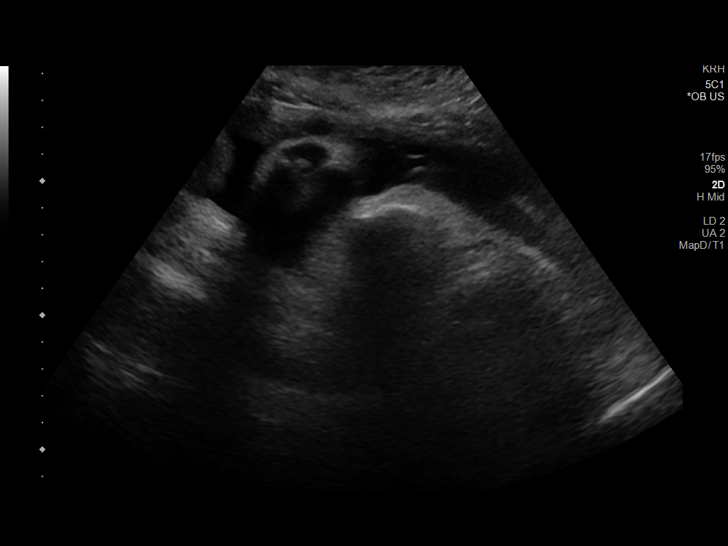
[im 4/36]
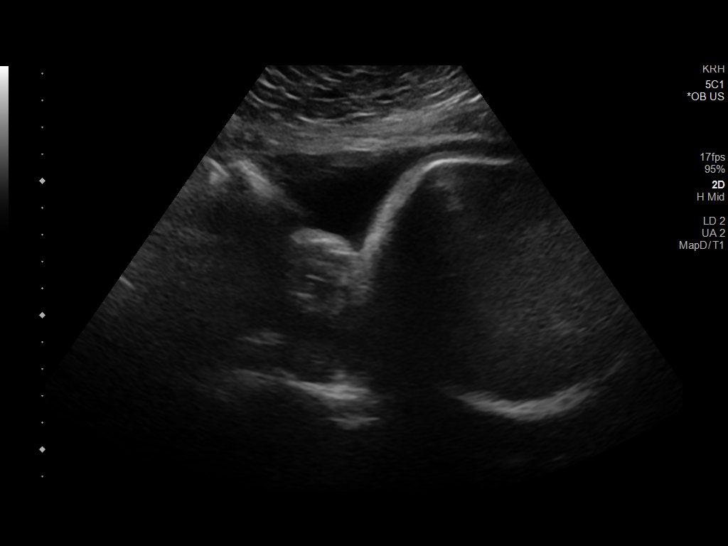
[im 7/36]
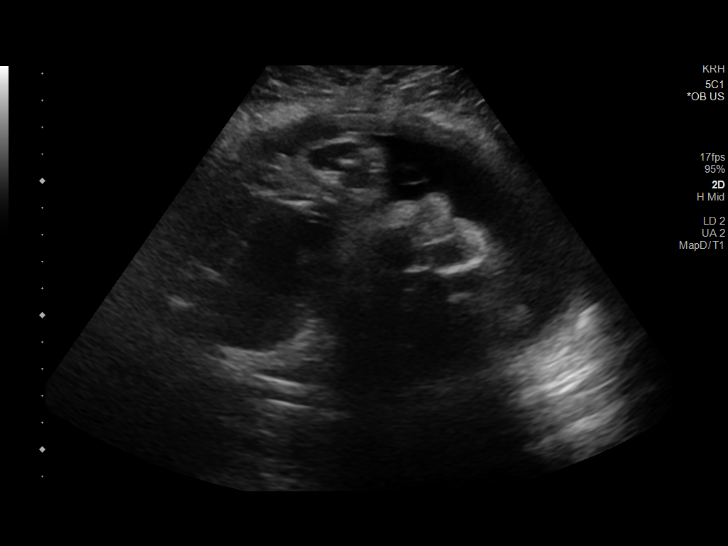
[im 10/36]
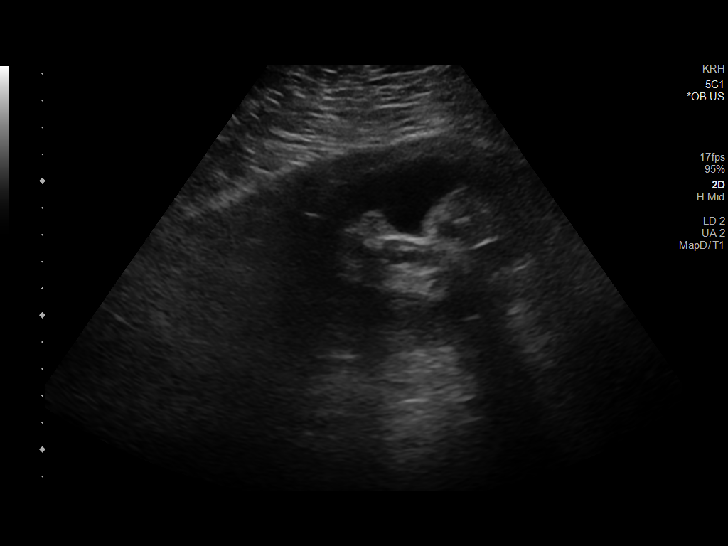
[im 12/36]
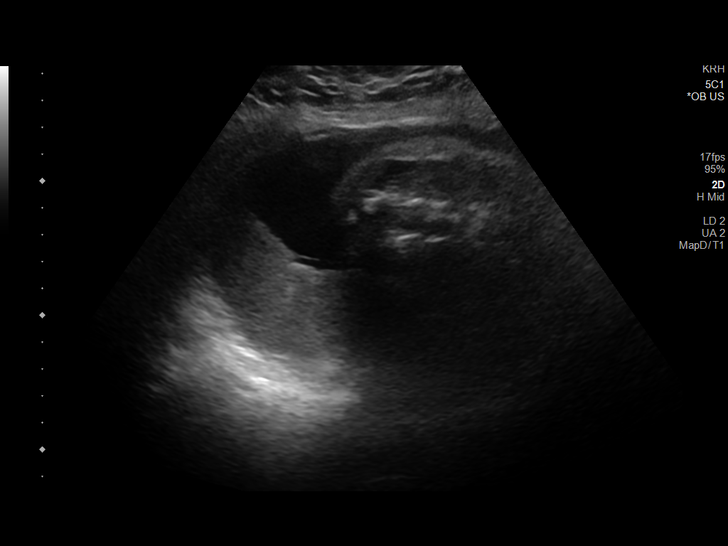
[im 15/36]
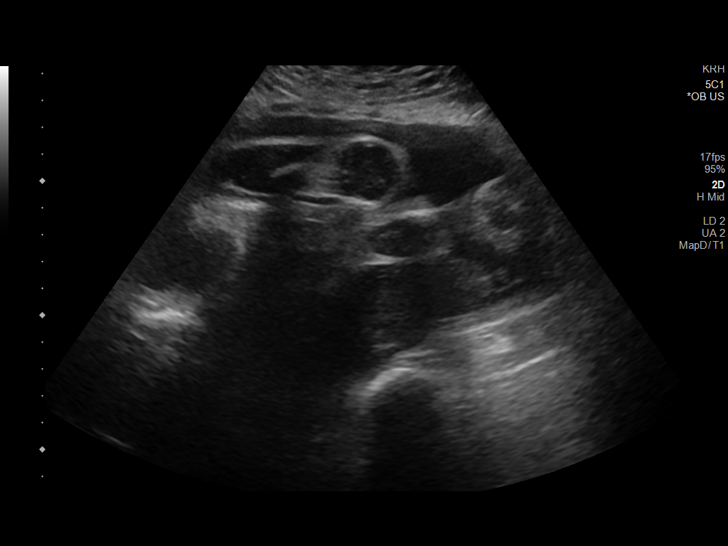
[im 17/36]
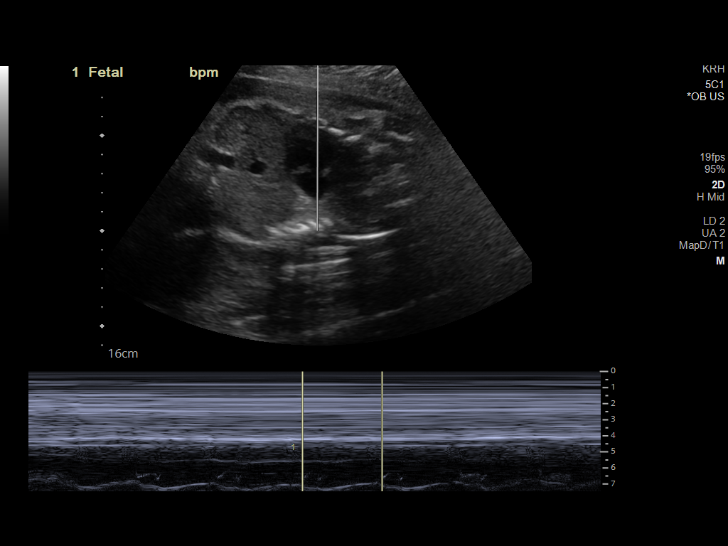
[im 20/36]
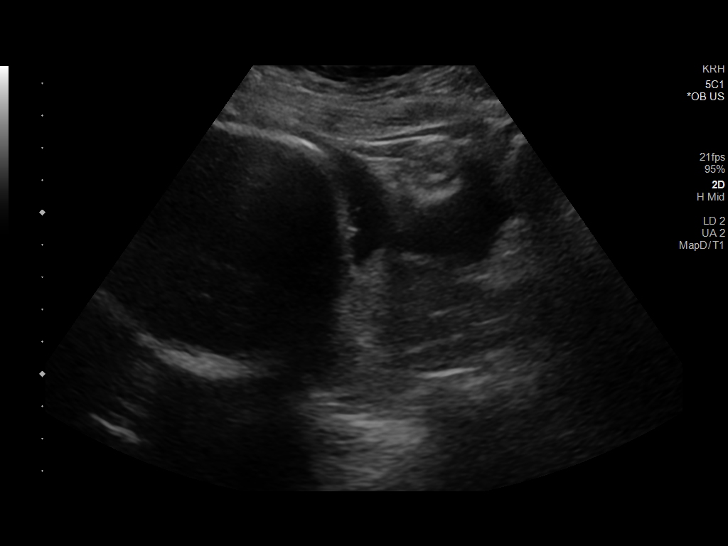
[im 23/36]
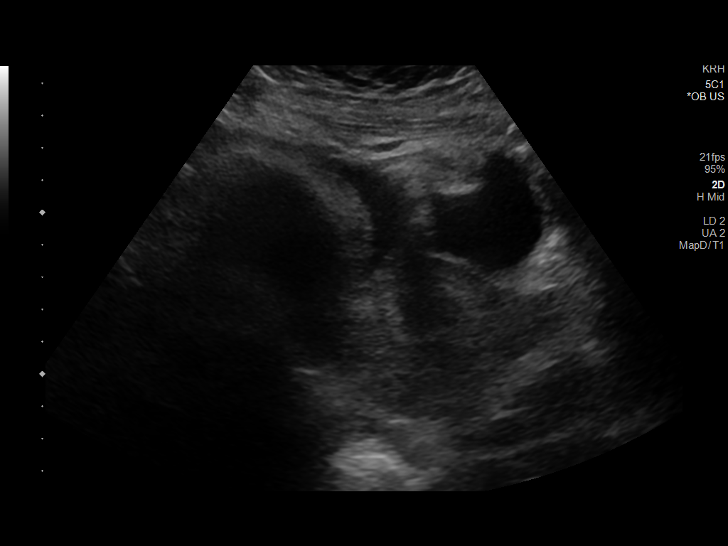
[im 25/36]
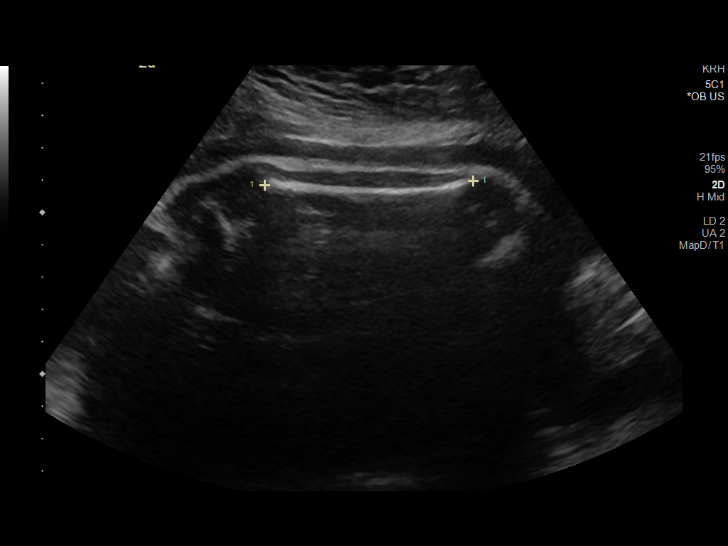
[im 28/36]
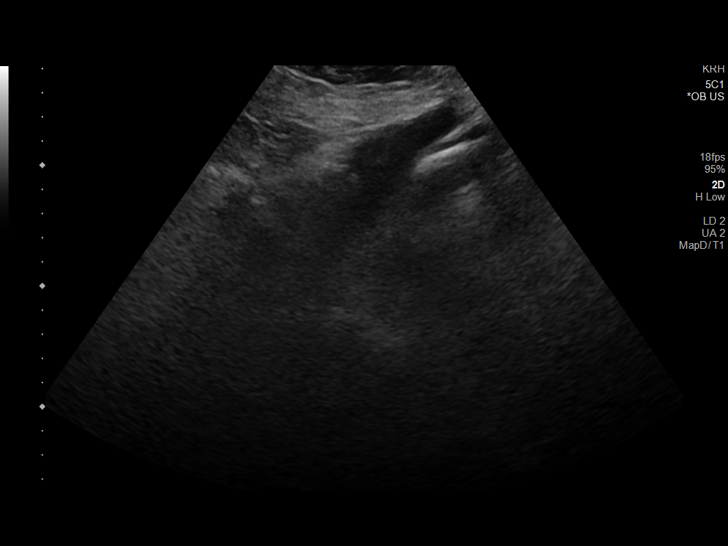
[im 30/36]
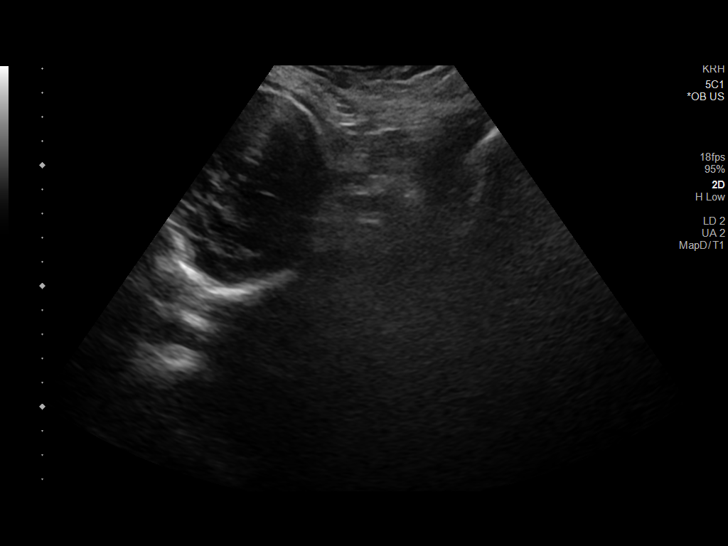
[im 33/36]
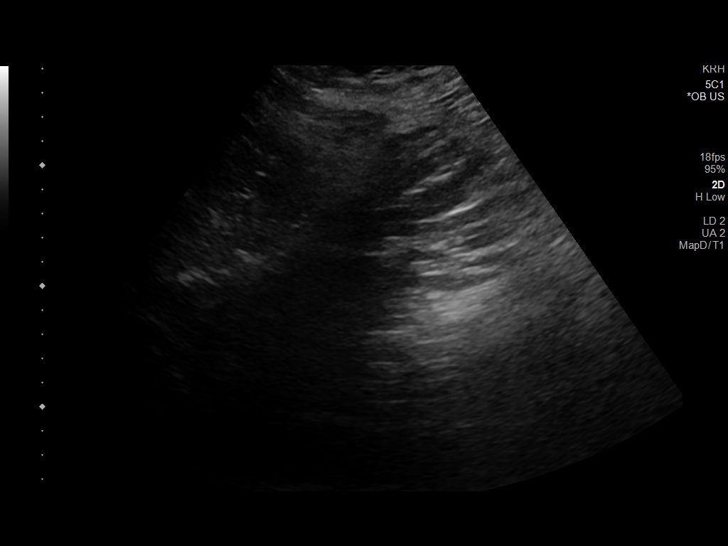
[im 36/36]
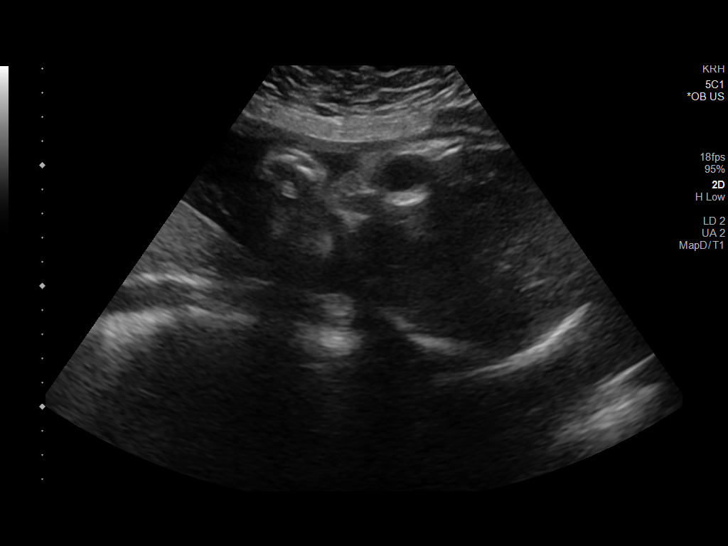

[14 of 28 positions shown; findings below may reference images not displayed]

FINDINGS: Number of Fetuses: 1

Heart Rate:  128 bpm

Movement: Yes.

Presentation: Cephalic.

Placental Location: Posterior fundal.

Previa: Negative.

Amniotic Fluid (Subjective):  Within normal limits.

BPD: Not performed due to too much motion. Femur length 6.4 cm
equivalent to 32 w 6 d EGA.

MATERNAL FINDINGS:

Cervix:  Appears closed.

Uterus/Adnexae: Ovaries not visualized.

No free fluid.
IMPRESSION: Single live IUP with estimated gestational age 32 weeks 6 days.

This exam is performed on an emergent basis and does not
comprehensively evaluate fetal size, dating, or anatomy; follow-up
complete OB US should be considered if further fetal assessment is
warranted.
